# Patient Record
Sex: Male | Born: 1990 | Race: Asian | Hispanic: No | Marital: Single | Smoking: Current some day smoker
Health system: Southern US, Community
[De-identification: ages and names within clinical notes are randomized; demographics above are authoritative.]

## PROBLEM LIST (undated history)

## (undated) HISTORY — PX: KNEE SURGERY: SHX244

---

## 2021-01-19 ENCOUNTER — Other Ambulatory Visit: Payer: Self-pay

## 2021-01-19 ENCOUNTER — Emergency Department (HOSPITAL_COMMUNITY)
Admission: EM | Admit: 2021-01-19 | Discharge: 2021-01-20 | Disposition: A | Discharge status: Home / Self Care | Payer: 59 | Attending: Emergency Medicine | Admitting: Emergency Medicine

## 2021-01-19 ENCOUNTER — Emergency Department (HOSPITAL_COMMUNITY): Payer: 59

## 2021-01-19 DIAGNOSIS — S52002A Unspecified fracture of upper end of left ulna, initial encounter for closed fracture: Secondary | ICD-10-CM | POA: Diagnosis not present

## 2021-01-19 DIAGNOSIS — Y9366 Activity, soccer: Secondary | ICD-10-CM | POA: Insufficient documentation

## 2021-01-19 DIAGNOSIS — U071 COVID-19: Secondary | ICD-10-CM | POA: Diagnosis not present

## 2021-01-19 DIAGNOSIS — W010XXA Fall on same level from slipping, tripping and stumbling without subsequent striking against object, initial encounter: Secondary | ICD-10-CM | POA: Diagnosis not present

## 2021-01-19 DIAGNOSIS — S52092A Other fracture of upper end of left ulna, initial encounter for closed fracture: Secondary | ICD-10-CM

## 2021-01-19 DIAGNOSIS — S59912A Unspecified injury of left forearm, initial encounter: Secondary | ICD-10-CM | POA: Diagnosis present

## 2021-01-19 MED ORDER — OXYCODONE-ACETAMINOPHEN 5-325 MG PO TABS
2.0000 | ORAL_TABLET | Freq: Once | ORAL | Status: AC
  Administered 2021-01-19: 2 via ORAL
  Filled 2021-01-19: qty 2

## 2021-01-19 MED ORDER — FENTANYL CITRATE (PF) 100 MCG/2ML IJ SOLN
100.0000 ug | Freq: Once | INTRAMUSCULAR | Status: AC
  Administered 2021-01-19: 100 ug via INTRAVENOUS
  Filled 2021-01-19: qty 2

## 2021-01-19 MED ORDER — OXYCODONE-ACETAMINOPHEN 5-325 MG PO TABS: 1.0000 | ORAL_TABLET | ORAL | 0 refills | Status: DC | PRN

## 2021-01-19 MED ORDER — SODIUM CHLORIDE 0.9 % IV BOLUS
1000.0000 mL | Freq: Once | INTRAVENOUS | Status: AC
  Administered 2021-01-19: 1000 mL via INTRAVENOUS

## 2021-01-19 MED ORDER — HYDROMORPHONE HCL 1 MG/ML IJ SOLN
1.0000 mg | Freq: Once | INTRAMUSCULAR | Status: AC
  Administered 2021-01-19: 1 mg via INTRAVENOUS
  Filled 2021-01-19: qty 1

## 2021-01-19 MED ORDER — OXYCODONE-ACETAMINOPHEN 5-325 MG PO TABS
2.0000 | ORAL_TABLET | ORAL | Status: DC | PRN
Start: 2021-01-19 — End: 2021-01-20
  Administered 2021-01-19: 2 via ORAL
  Filled 2021-01-19: qty 2

## 2021-01-19 MED ORDER — KETOROLAC TROMETHAMINE 15 MG/ML IJ SOLN
15.0000 mg | Freq: Once | INTRAMUSCULAR | Status: AC
  Administered 2021-01-19: 15 mg via INTRAVENOUS
  Filled 2021-01-19: qty 1

## 2021-01-19 NOTE — Discharge Instructions (Addendum)
Take both ibuprofen and Tylenol for pain.  You may alternate these as needed.  If you are still having severe pain, then take the stronger pain medicine oxycodone.

## 2021-01-19 NOTE — ED Provider Notes (Signed)
Emergency Medicine Provider Triage Evaluation Note  Keston Zubab Lang Zingg , a 30 y.o. male  was evaluated in triage.  Pt complains of pain in the left arm.  He is right-hand dominant.  He was playing soccer, fell and heard a crack in his arm.  He states that he felt backwards and braced himself.  He denies any pain anywhere other than his left arm.  His pain is primarily around the elbow.  He did not strike his head..  Review of Systems  Positive: Left arm pain Negative: Numbness, weakness, tingling  Physical Exam  BP 104/81 (BP Location: Right Arm)   Pulse 62   Temp 98.8 F (37.1 C) (Oral)   Resp 20   SpO2 100%  Gen:   Awake, appears to be in pain Resp:  Normal effort  MSK:   Left elbow is obviously swollen and deformed.  He is able to move fingers on left hand.  There is no localized tenderness over the left shoulder. Other:  No pain along left clavicle.  Medical Decision Making  Medically screening exam initiated at 9:56 PM.  Appropriate orders placed.  Edilson Zubab Ibne Komperda was informed that the remainder of the evaluation will be completed by another provider, this initial triage assessment does not replace that evaluation, and the importance of remaining in the ED until their evaluation is complete.     Norman Clay 01/19/21 2157    Pollyann Savoy, Sumedh 01/19/21 2232

## 2021-01-19 NOTE — ED Triage Notes (Signed)
Pt reports playing soccer, fell, heard a crack in his arm. Pt with left arm deformity. Appears in distress due to pain.

## 2021-01-19 NOTE — ED Provider Notes (Signed)
[facility] EMERGENCY DEPARTMENT Provider Note   CSN: [id number] Arrival date & time: 01/19/21  2056     History Chief Complaint  Patient presents with   Arm Injury    Steven Aguirre is a 30 y.o. male.  HPI 30 year old male presents after a left arm injury.  He was playing soccer and he slipped and his left arm went behind him causing an injury to his proximal forearm.  He is having severe pain.  He was given oral Percocet in the waiting room without relief.  No numbness or weakness.  No other injuries.  No past medical history on file.  There are no problems to display for this patient.        No family history on file.     Home Medications Prior to Admission medications   Medication Sig Start Date End Date Taking? Authorizing Provider  acetaminophen (TYLENOL) 500 MG tablet Take 500 mg by mouth every 6 (six) hours as needed for moderate pain or headache.   Yes Provider, Historical, Lonnie  ibuprofen (ADVIL) 200 MG tablet Take 800 mg by mouth every 6 (six) hours as needed for mild pain or headache.   Yes Provider, Historical, Mehki  oxyCODONE-acetaminophen (PERCOCET/ROXICET) 5-325 MG tablet Take 1 tablet by mouth every 4 (four) hours as needed for severe pain. 01/19/21  Yes Pricilla Loveless, Cosby  vitamin C (ASCORBIC ACID) 500 MG tablet Take 500 mg by mouth daily.   Yes Provider, Historical, Khriz    Allergies    Patient has no known allergies.  Review of Systems   Review of Systems  Musculoskeletal:  Positive for arthralgias and joint swelling.  Neurological:  Negative for weakness and numbness.  All other systems reviewed and are negative.  Physical Exam Updated Vital Signs BP 112/82   Pulse 90   Temp 98.8 F (37.1 C) (Oral)   Resp 19   SpO2 95%   Physical Exam Vitals and nursing note reviewed.  Constitutional:      General: He is in acute distress (in pain).     Appearance: He is well-developed.  HENT:     Head: Normocephalic and atraumatic.      Right Ear: External ear normal.     Left Ear: External ear normal.     Nose: Nose normal.  Eyes:     General:        Right eye: No discharge.        Left eye: No discharge.  Cardiovascular:     Rate and Rhythm: Normal rate and regular rhythm.     Pulses:          Radial pulses are 2+ on the left side.  Pulmonary:     Effort: Pulmonary effort is normal.  Abdominal:     General: There is no distension.  Musculoskeletal:     Left upper arm: No tenderness.     Left elbow: Tenderness present.     Left forearm: Swelling (proximal) and tenderness present.     Left wrist: No swelling, deformity or tenderness. Normal range of motion.     Left hand: No swelling or tenderness. Normal range of motion.     Cervical back: Neck supple.     Comments: Normal strength/sensation in left hand Normal radial, ulnar, median nerve testing and left hand.  Skin:    General: Skin is warm and dry.  Neurological:     Mental Status: He is alert.  Psychiatric:  Mood and Affect: Mood is not anxious.    ED Results / Procedures / Treatments   Labs (all labs ordered are listed, but only abnormal results are displayed) Labs Reviewed  RESP PANEL BY RT-PCR (FLU A&B, COVID) [id number]    EKG None  Radiology DG Forearm Left  Result Date: 01/19/2021 CLINICAL DATA:  Fall while playing soccer with arm pain, initial encounter EXAM: LEFT FOREARM - 2 VIEW COMPARISON:  None. FINDINGS: Comminuted proximal ulnar fracture is noted without evidence of dislocation. Small avulsion is noted likely from the lateral epicondyle. No definitive radial fracture is seen. IMPRESSION: Comminuted proximal ulnar fracture without dislocation. Electronically Signed   By: Alcide Clever M.D.   On: 01/19/2021 22:55   DG Humerus Left  Result Date: 01/19/2021 CLINICAL DATA:  Recent fall while playing soccer with arm deformity, initial encounter EXAM: LEFT HUMERUS - 2+ VIEW COMPARISON:  None. FINDINGS: Humerus is well visualized and  within normal limits. Comminuted fracture of the proximal ulna is seen. No dislocation is noted. IMPRESSION: Proximal ulnar fracture is noted. The humerus appears within normal limits. Electronically Signed   By: Alcide Clever M.D.   On: 01/19/2021 22:56    Procedures Procedures   Medications Ordered in ED Medications  oxyCODONE-acetaminophen (PERCOCET/ROXICET) 5-325 MG per tablet 2 tablet (2 tablets Oral Given 01/19/21 2156)  ketorolac (TORADOL) 15 MG/ML injection 15 mg (has no administration in time range)  oxyCODONE-acetaminophen (PERCOCET/ROXICET) 5-325 MG per tablet 2 tablet (has no administration in time range)  fentaNYL (SUBLIMAZE) injection 100 mcg (100 mcg Intravenous Given 01/19/21 2215)  sodium chloride 0.9 % bolus 1,000 mL (0 mLs Intravenous Stopped 01/19/21 2258)  HYDROmorphone (DILAUDID) injection 1 mg (1 mg Intravenous Given 01/19/21 2303)    ED Course  I have reviewed the triage vital signs and the nursing notes.  Pertinent labs & imaging results that were available during my care of the patient were reviewed by me and considered in my medical decision making (see chart for details).    MDM Rules/Calculators/A&P                          Patient's x-rays have been personally reviewed and he has a proximal ulna fracture that is comminuted but not displaced.  He has swelling to his proximal forearm but no open injury.  His pain is now better controlled.  I discussed with Dr. Dion Saucier, will place in long-arm posterior splint and have him follow-up in the next 3-5 days in his office.  We will send pain medicine and we discussed return precautions but his pain seems much better and he appears stable for discharge home.  No neurovascular injury at this point. Final Clinical Impression(s) / ED Diagnoses Final diagnoses:  Closed comminuted fracture of proximal end of left ulna, initial encounter    Rx / DC Orders ED Discharge Orders          Ordered    oxyCODONE-acetaminophen  (PERCOCET/ROXICET) 5-325 MG tablet  Every 4 hours PRN        01/19/21 2324             Pricilla Loveless, Elgie 01/19/21 2328

## 2021-01-20 LAB — RESP PANEL BY RT-PCR (FLU A&B, COVID) ARPGX2
Influenza A by PCR: NEGATIVE
Influenza B by PCR: NEGATIVE
SARS Coronavirus 2 by RT PCR: POSITIVE — AB

## 2021-01-20 NOTE — Progress Notes (Signed)
Orthopedic Tech Progress Note Patient Details:  Steven Aguirre [birth year] [id number]  Ortho Devices Type of Ortho Device: Long arm splint, Arm sling Ortho Device/Splint Location: LUE Ortho Device/Splint Interventions: Ordered, Application, Adjustment   Post Interventions Patient Tolerated: Poor Instructions Provided: Adjustment of device, Care of device, Poper ambulation with device  Steven Aguirre 01/20/2021, 12:14 AM

## 2021-01-23 ENCOUNTER — Other Ambulatory Visit: Payer: Self-pay

## 2021-01-23 ENCOUNTER — Encounter (HOSPITAL_COMMUNITY): Payer: Self-pay | Admitting: Orthopaedic Surgery

## 2021-01-23 NOTE — Progress Notes (Signed)
COVID Vaccine Completed: Yes Date COVID Vaccine completed: 1 Has received booster:x1 COVID vaccine manufacturer:   [employer]  Date of COVID positive in last 90 days: 01/19/2021  PCP - N/A Cardiologist - N/A  Chest x-ray - N/A EKG - N/A Stress Test - N/A ECHO - N/A Cardiac Cath - N/A Pacemaker/ICD device last checked:N/A  Sleep Study - N/A CPAP - N/A  Fasting Blood Sugar - N/A Checks Blood Sugar ___N/A__ times a day  Blood Thinner Instructions: N/A Aspirin Instructions: N/A Last Dose: N/A  Activity level:  Able to exercise without symptoms     Anesthesia review: N/A  Patient denies shortness of breath, fever, cough and chest pain at PAT appointment   Patient verbalized understanding of instructions that were given to them at the PAT appointment. Patient was also instructed that they will need to review over the PAT instructions again at home before surgery.

## 2021-01-23 NOTE — H&P (Signed)
PREOPERATIVE H&P  Chief Complaint: LEFT ELBOW FRACTURE,COLLATERAL LIGAMENT TEAR  HPI: Steven Aguirre is a 30 y.o. male who is scheduled for, Procedure(s): OPEN REDUCTION INTERNAL FIXATION (ORIF) ELBOW/OLECRANON FRACTURE ULNAR COLLATERAL LIGAMENT REPAIR.   Patient is a healthy 30 year old who had an arm injury on 6/17. He was playing soccer and slipped. He tried to catch himself with his left hand. He had immediate pain. He went to [facility] ED. Xrays showed proximal ulnar fracture. He was splinted and sent to orthopedics for surgical discussion.   His symptoms are rated as moderate to severe, and have been worsening.  This is significantly impairing activities of daily living.    Please see clinic note for further details on this patient's care.    He has elected for surgical management.   History reviewed. No pertinent past medical history. Past Surgical History:  Procedure Laterality Date   KNEE SURGERY Right    Social History   Socioeconomic History   Marital status: Single    Spouse name: Not on file   Number of children: Not on file   Years of education: Not on file   Highest education level: Not on file  Occupational History   Not on file  Tobacco Use   Smoking status: Some Days    Pack years: 0.00    Types: Cigarettes   Smokeless tobacco: Former   Tobacco comments:    Smoke one a week or twice a month  Vaping Use   Vaping Use: Not on file  Substance and Sexual Activity   Alcohol use: Never   Drug use: Never   Sexual activity: Not on file  Other Topics Concern   Not on file  Social History Narrative   Not on file   Social Determinants of Health   Financial Resource Strain: Not on file  Food Insecurity: Not on file  Transportation Needs: Not on file  Physical Activity: Not on file  Stress: Not on file  Social Connections: Not on file   History reviewed. No pertinent family history. No Known Allergies Prior to Admission medications    Medication Sig Start Date End Date Taking? Authorizing Provider  acetaminophen (TYLENOL) 500 MG tablet Take 500 mg by mouth every 6 (six) hours as needed for moderate pain or headache.    Provider, Historical, Armoni  ibuprofen (ADVIL) 200 MG tablet Take 800 mg by mouth every 6 (six) hours as needed for mild pain or headache.    Provider, Historical, Raef  oxyCODONE-acetaminophen (PERCOCET/ROXICET) 5-325 MG tablet Take 1 tablet by mouth every 4 (four) hours as needed for severe pain. 01/19/21   Pricilla Loveless, Elius  vitamin C (ASCORBIC ACID) 500 MG tablet Take 500 mg by mouth daily.    Provider, Historical, Arnie    ROS: All other systems have been reviewed and were otherwise negative with the exception of those mentioned in the HPI and as above.  Physical Exam: General: Alert, no acute distress Cardiovascular: No pedal edema Respiratory: No cyanosis, no use of accessory musculature GI: No organomegaly, abdomen is soft and non-tender Skin: No lesions in the area of chief complaint Neurologic: Sensation intact distally Psychiatric: Patient is competent for consent with normal mood and affect Lymphatic: No axillary or cervical lymphadenopathy  MUSCULOSKELETAL:  Left upper extremity: Splint CDI. Skin intact though cannot assess fully beneath splint. Nontender to palpation proximally. Distal motor and sensory function intact. Well perfused digits.    Imaging: Xrays demonstrate comminuted proximal ulnar  fracture is noted without evidence of dislocation. Small avulsion is noted likely from the lateral epicondyle. No definitive radial fracture is seen.  Assessment: LEFT ELBOW FRACTURE,COLLATERAL LIGAMENT TEAR  Plan: Plan for Procedure(s): OPEN REDUCTION INTERNAL FIXATION (ORIF) ELBOW/OLECRANON FRACTURE ULNAR COLLATERAL LIGAMENT REPAIR  The risks benefits and alternatives were discussed with the patient including but not limited to the risks of nonoperative treatment, versus surgical intervention  including infection, bleeding, nerve injury,  blood clots, cardiopulmonary complications, morbidity, mortality, among others, and they were willing to proceed.   The patient acknowledged the explanation, agreed to proceed with the plan and consent was signed.   Operative Plan: ORIF proximal ulnar fracture with repair of ulnar collateral ligament Discharge Medications: Tylenol, Gabapentin, Meloxicam, Oxycodone, Zofran DVT Prophylaxis: None Physical Therapy: Outpatient Special Discharge needs: Splint. Sling   Vernetta Honey, PA-C  01/23/2021 5:22 PM

## 2021-01-25 ENCOUNTER — Ambulatory Visit (HOSPITAL_COMMUNITY): Payer: 59

## 2021-01-25 ENCOUNTER — Ambulatory Visit (HOSPITAL_COMMUNITY)
Admission: RE | Admit: 2021-01-25 | Discharge: 2021-01-25 | Disposition: A | Discharge status: Home / Self Care | Payer: 59 | Attending: Orthopaedic Surgery | Admitting: Orthopaedic Surgery

## 2021-01-25 ENCOUNTER — Encounter (HOSPITAL_COMMUNITY): Payer: Self-pay | Admitting: Orthopaedic Surgery

## 2021-01-25 ENCOUNTER — Encounter (HOSPITAL_COMMUNITY)
Admission: RE | Disposition: A | Discharge status: Home / Self Care | Payer: Self-pay | Source: Home / Self Care | Attending: Orthopaedic Surgery

## 2021-01-25 ENCOUNTER — Ambulatory Visit (HOSPITAL_COMMUNITY): Payer: 59 | Admitting: Physician Assistant

## 2021-01-25 DIAGNOSIS — Y9366 Activity, soccer: Secondary | ICD-10-CM | POA: Diagnosis not present

## 2021-01-25 DIAGNOSIS — Z419 Encounter for procedure for purposes other than remedying health state, unspecified: Secondary | ICD-10-CM

## 2021-01-25 DIAGNOSIS — S52022A Displaced fracture of olecranon process without intraarticular extension of left ulna, initial encounter for closed fracture: Secondary | ICD-10-CM | POA: Insufficient documentation

## 2021-01-25 DIAGNOSIS — F17210 Nicotine dependence, cigarettes, uncomplicated: Secondary | ICD-10-CM | POA: Diagnosis not present

## 2021-01-25 HISTORY — PX: ORIF ELBOW FRACTURE: SHX5031

## 2021-01-25 HISTORY — PX: ULNAR COLLATERAL LIGAMENT REPAIR: SHX6159

## 2021-01-25 SURGERY — OPEN REDUCTION INTERNAL FIXATION (ORIF) ELBOW/OLECRANON FRACTURE
Anesthesia: General | Site: Elbow | Laterality: Left

## 2021-01-25 MED ORDER — AMISULPRIDE (ANTIEMETIC) 5 MG/2ML IV SOLN: 10.0000 mg | Freq: Once | INTRAVENOUS | Status: DC | PRN

## 2021-01-25 MED ORDER — FENTANYL CITRATE (PF) 100 MCG/2ML IJ SOLN
INTRAMUSCULAR | Status: AC
  Filled 2021-01-25: qty 2

## 2021-01-25 MED ORDER — FENTANYL CITRATE (PF) 100 MCG/2ML IJ SOLN: 25.0000 ug | INTRAMUSCULAR | Status: DC | PRN

## 2021-01-25 MED ORDER — PROPOFOL 10 MG/ML IV BOLUS
INTRAVENOUS | Status: DC | PRN
  Administered 2021-01-25: 150 mg via INTRAVENOUS

## 2021-01-25 MED ORDER — OXYCODONE HCL 5 MG PO TABS: 5.0000 mg | ORAL_TABLET | Freq: Once | ORAL | Status: DC | PRN

## 2021-01-25 MED ORDER — MEPERIDINE HCL 50 MG/ML IJ SOLN
INTRAMUSCULAR | Status: AC
  Filled 2021-01-25: qty 1

## 2021-01-25 MED ORDER — CEFAZOLIN SODIUM-DEXTROSE 2-4 GM/100ML-% IV SOLN
INTRAVENOUS | Status: AC
  Filled 2021-01-25: qty 100

## 2021-01-25 MED ORDER — LIDOCAINE 2% (20 MG/ML) 5 ML SYRINGE
INTRAMUSCULAR | Status: DC | PRN
  Administered 2021-01-25: 40 mg via INTRAVENOUS

## 2021-01-25 MED ORDER — KETAMINE HCL 10 MG/ML IJ SOLN
INTRAMUSCULAR | Status: DC | PRN
  Administered 2021-01-25 (×4): 5 mg via INTRAVENOUS

## 2021-01-25 MED ORDER — KETOROLAC TROMETHAMINE 30 MG/ML IJ SOLN
INTRAMUSCULAR | Status: AC
  Administered 2021-01-25: 30 mg via INTRAVENOUS
  Filled 2021-01-25: qty 1

## 2021-01-25 MED ORDER — ONDANSETRON HCL 4 MG/2ML IJ SOLN
INTRAMUSCULAR | Status: DC | PRN
  Administered 2021-01-25: 4 mg via INTRAVENOUS

## 2021-01-25 MED ORDER — DEXAMETHASONE SODIUM PHOSPHATE 10 MG/ML IJ SOLN
INTRAMUSCULAR | Status: AC
  Filled 2021-01-25: qty 1

## 2021-01-25 MED ORDER — DICLOFENAC SODIUM 75 MG PO TBEC: 75.0000 mg | DELAYED_RELEASE_TABLET | Freq: Two times a day (BID) | ORAL | 0 refills | Status: AC

## 2021-01-25 MED ORDER — PROMETHAZINE HCL 25 MG/ML IJ SOLN: 6.2500 mg | INTRAMUSCULAR | Status: DC | PRN

## 2021-01-25 MED ORDER — KETAMINE HCL 10 MG/ML IJ SOLN
INTRAMUSCULAR | Status: AC
  Filled 2021-01-25: qty 1

## 2021-01-25 MED ORDER — LIDOCAINE 2% (20 MG/ML) 5 ML SYRINGE
INTRAMUSCULAR | Status: AC
  Filled 2021-01-25: qty 5

## 2021-01-25 MED ORDER — CEFAZOLIN SODIUM-DEXTROSE 2-4 GM/100ML-% IV SOLN
2.0000 g | INTRAVENOUS | Status: AC
  Administered 2021-01-25: 2 g via INTRAVENOUS

## 2021-01-25 MED ORDER — PROPOFOL 10 MG/ML IV BOLUS
INTRAVENOUS | Status: AC
  Filled 2021-01-25: qty 20

## 2021-01-25 MED ORDER — LACTATED RINGERS IV SOLN: INTRAVENOUS | Status: DC

## 2021-01-25 MED ORDER — SUCCINYLCHOLINE CHLORIDE 20 MG/ML IJ SOLN
INTRAMUSCULAR | Status: DC | PRN
  Administered 2021-01-25: 60 mg via INTRAVENOUS

## 2021-01-25 MED ORDER — BUPIVACAINE-EPINEPHRINE (PF) 0.5% -1:200000 IJ SOLN
INTRAMUSCULAR | Status: DC | PRN
  Administered 2021-01-25: 30 mL via PERINEURAL

## 2021-01-25 MED ORDER — VANCOMYCIN HCL 1000 MG IV SOLR
INTRAVENOUS | Status: DC | PRN
  Administered 2021-01-25: 1000 mg

## 2021-01-25 MED ORDER — MIDAZOLAM HCL 2 MG/2ML IJ SOLN
INTRAMUSCULAR | Status: AC
  Filled 2021-01-25: qty 2

## 2021-01-25 MED ORDER — BUPIVACAINE HCL 0.25 % IJ SOLN
INTRAMUSCULAR | Status: AC
  Filled 2021-01-25: qty 1

## 2021-01-25 MED ORDER — MIDAZOLAM HCL 5 MG/5ML IJ SOLN
INTRAMUSCULAR | Status: DC | PRN
  Administered 2021-01-25: 2 mg via INTRAVENOUS

## 2021-01-25 MED ORDER — ACETAMINOPHEN 500 MG PO TABS: 1000.0000 mg | ORAL_TABLET | Freq: Three times a day (TID) | ORAL | 0 refills | Status: AC

## 2021-01-25 MED ORDER — ACETAMINOPHEN 10 MG/ML IV SOLN: 1000.0000 mg | Freq: Once | INTRAVENOUS | Status: DC | PRN

## 2021-01-25 MED ORDER — ONDANSETRON HCL 4 MG PO TABS: 4.0000 mg | ORAL_TABLET | Freq: Three times a day (TID) | ORAL | 0 refills | Status: AC | PRN

## 2021-01-25 MED ORDER — MEPERIDINE HCL 50 MG/ML IJ SOLN
6.2500 mg | INTRAMUSCULAR | Status: DC | PRN
Start: 2021-01-25 — End: 2021-01-25
  Administered 2021-01-25: 6.25 mg via INTRAVENOUS

## 2021-01-25 MED ORDER — ONDANSETRON HCL 4 MG/2ML IJ SOLN
INTRAMUSCULAR | Status: AC
  Filled 2021-01-25: qty 2

## 2021-01-25 MED ORDER — OXYCODONE HCL 5 MG PO TABS: ORAL_TABLET | ORAL | 0 refills | Status: AC

## 2021-01-25 MED ORDER — DEXAMETHASONE SODIUM PHOSPHATE 10 MG/ML IJ SOLN
INTRAMUSCULAR | Status: DC | PRN
  Administered 2021-01-25: 5 mg via INTRAVENOUS

## 2021-01-25 MED ORDER — OXYCODONE HCL 5 MG/5ML PO SOLN
5.0000 mg | Freq: Once | ORAL | Status: DC | PRN
Start: 2021-01-25 — End: 2021-01-25

## 2021-01-25 MED ORDER — GABAPENTIN 100 MG PO CAPS: 100.0000 mg | ORAL_CAPSULE | Freq: Three times a day (TID) | ORAL | 0 refills | Status: AC

## 2021-01-25 MED ORDER — FENTANYL CITRATE (PF) 100 MCG/2ML IJ SOLN
INTRAMUSCULAR | Status: DC | PRN
  Administered 2021-01-25 (×2): 25 ug via INTRAVENOUS
  Administered 2021-01-25: 50 ug via INTRAVENOUS

## 2021-01-25 MED ORDER — DEXMEDETOMIDINE (PRECEDEX) IN NS 20 MCG/5ML (4 MCG/ML) IV SYRINGE
PREFILLED_SYRINGE | INTRAVENOUS | Status: AC
  Filled 2021-01-25: qty 5

## 2021-01-25 MED ORDER — KETOROLAC TROMETHAMINE 30 MG/ML IJ SOLN: 30.0000 mg | Freq: Once | INTRAMUSCULAR | Status: AC

## 2021-01-25 SURGICAL SUPPLY — 114 items
ADAPTER K-WIRE FAST GUIDE 2.0 (WIRE) ×3 IMPLANT
ANCHOR JUGGERKNOT W/DRL 2/1.45 (Anchor) ×3 IMPLANT
BAG COUNTER SPONGE SURGICOUNT (BAG) IMPLANT
BAG SURGICOUNT SPONGE COUNTING (BAG)
BENZOIN TINCTURE PRP APPL 2/3 (GAUZE/BANDAGES/DRESSINGS) IMPLANT
BIT DRILL 2.0 (BIT) ×1
BIT DRILL 2.0MM (BIT) ×1
BIT DRILL 2.5X2.75 QC CALB (BIT) ×3 IMPLANT
BIT DRILL 2XNS DISP SS SM FRAG (BIT) ×1 IMPLANT
BIT DRILL CALIBRATED 2.7 (BIT) ×2 IMPLANT
BIT DRILL CALIBRATED 2.7MM (BIT) ×1
BIT DRL 2XNS DISP SS SM FRAG (BIT) ×1
BLADE HEX COATED 2.75 (ELECTRODE) IMPLANT
BLADE SURG 15 STRL LF DISP TIS (BLADE) ×1 IMPLANT
BLADE SURG 15 STRL SS (BLADE) ×2
BLADE SURG SZ10 CARB STEEL (BLADE) IMPLANT
BNDG COHESIVE 4X5 TAN STRL (GAUZE/BANDAGES/DRESSINGS) IMPLANT
BNDG ELASTIC 4X5.8 VLCR STR LF (GAUZE/BANDAGES/DRESSINGS) ×3 IMPLANT
BNDG ESMARK 4X9 LF (GAUZE/BANDAGES/DRESSINGS) IMPLANT
BOOTIES KNEE HIGH SLOAN (MISCELLANEOUS) IMPLANT
BRUSH SCRUB EZ PLAIN DRY (MISCELLANEOUS) ×3 IMPLANT
BUR EGG ELITE 4.0 (BURR) ×2 IMPLANT
BUR EGG ELITE 4.0MM (BURR) ×1
CHLORAPREP W/TINT 26 (MISCELLANEOUS) IMPLANT
CLOSURE STERI-STRIP 1/2X4 (GAUZE/BANDAGES/DRESSINGS) ×1
CLOSURE WOUND 1/2 X4 (GAUZE/BANDAGES/DRESSINGS)
CLSR STERI-STRIP ANTIMIC 1/2X4 (GAUZE/BANDAGES/DRESSINGS) ×2 IMPLANT
COVER BACK TABLE 60X90IN (DRAPES) IMPLANT
CUFF TOURN SGL QUICK 18X4 (TOURNIQUET CUFF) ×3 IMPLANT
CUFF TOURN SGL QUICK 24 (TOURNIQUET CUFF)
CUFF TOURN SGL QUICK 34 (TOURNIQUET CUFF)
CUFF TRNQT CYL 24X4X16.5-23 (TOURNIQUET CUFF) IMPLANT
CUFF TRNQT CYL 34X4.125X (TOURNIQUET CUFF) IMPLANT
DECANTER SPIKE VIAL GLASS SM (MISCELLANEOUS) IMPLANT
DRAPE C-ARM 42X120 X-RAY (DRAPES) ×3 IMPLANT
DRAPE C-ARMOR (DRAPES) ×3 IMPLANT
DRAPE EXTREMITY T 121X128X90 (DISPOSABLE) IMPLANT
DRAPE IMP U-DRAPE 54X76 (DRAPES) IMPLANT
DRAPE INCISE IOBAN 66X45 STRL (DRAPES) IMPLANT
DRAPE OEC MINIVIEW 54X84 (DRAPES) IMPLANT
DRAPE ORTHO SPLIT 77X108 STRL (DRAPES) ×4
DRAPE SHEET LG 3/4 BI-LAMINATE (DRAPES) ×3 IMPLANT
DRAPE SURG ORHT 6 SPLT 77X108 (DRAPES) ×2 IMPLANT
DRAPE U-SHAPE 47X51 STRL (DRAPES) IMPLANT
DRSG AQUACEL AG ADV 3.5X 6 (GAUZE/BANDAGES/DRESSINGS) IMPLANT
DRSG PAD ABDOMINAL 8X10 ST (GAUZE/BANDAGES/DRESSINGS) ×3 IMPLANT
DRSG TEGADERM 4X4.75 (GAUZE/BANDAGES/DRESSINGS) IMPLANT
DURAPREP 26ML APPLICATOR (WOUND CARE) ×6 IMPLANT
ELECT REM PT RETURN 15FT ADLT (MISCELLANEOUS) ×3 IMPLANT
GAUZE SPONGE 4X4 12PLY STRL (GAUZE/BANDAGES/DRESSINGS) ×3 IMPLANT
GAUZE XEROFORM 1X8 LF (GAUZE/BANDAGES/DRESSINGS) IMPLANT
GAUZE XEROFORM 5X9 LF (GAUZE/BANDAGES/DRESSINGS) IMPLANT
GLOVE SRG 8 PF TXTR STRL LF DI (GLOVE) ×1 IMPLANT
GLOVE SURG ENC MOIS LTX SZ6.5 (GLOVE) IMPLANT
GLOVE SURG LTX SZ8 (GLOVE) IMPLANT
GLOVE SURG UNDER POLY LF SZ6.5 (GLOVE) ×3 IMPLANT
GLOVE SURG UNDER POLY LF SZ8 (GLOVE) ×2
GOWN STRL REUS W/ TWL LRG LVL3 (GOWN DISPOSABLE) ×1 IMPLANT
GOWN STRL REUS W/TWL LRG LVL3 (GOWN DISPOSABLE) ×8 IMPLANT
GOWN STRL REUS W/TWL XL LVL3 (GOWN DISPOSABLE) ×6 IMPLANT
K-WIRE FIXATION 2.0X6 (WIRE) ×3
KIT BASIN OR (CUSTOM PROCEDURE TRAY) ×3 IMPLANT
KWIRE FIXATION 2.0X6 (WIRE) ×1 IMPLANT
NS IRRIG 1000ML POUR BTL (IV SOLUTION) ×3 IMPLANT
PACK ARTHROSCOPY WL (CUSTOM PROCEDURE TRAY) ×3 IMPLANT
PACK SHOULDER (CUSTOM PROCEDURE TRAY) ×3 IMPLANT
PAD CAST 4YDX4 CTTN HI CHSV (CAST SUPPLIES) ×2 IMPLANT
PADDING CAST COTTON 4X4 STRL (CAST SUPPLIES) ×4
PENCIL SMOKE EVACUATOR (MISCELLANEOUS) IMPLANT
PLATE OLECRANON LRG (Plate) ×3 IMPLANT
RETRIEVER SUT HEWSON (MISCELLANEOUS) IMPLANT
SCREW CORTICAL 2.7MM  20MM (Screw) ×2 IMPLANT
SCREW CORTICAL 2.7MM 20MM (Screw) ×1 IMPLANT
SCREW CORTICAL LOW PROF 3.5X20 (Screw) ×6 IMPLANT
SCREW LOCK CORT STAR 3.5X20 (Screw) ×3 IMPLANT
SCREW LOW PROFILE 22MMX3.5MM (Screw) ×3 IMPLANT
SCREW NON LOCKING LP 3.5 16MM (Screw) ×3 IMPLANT
SLEEVE SCD COMPRESS KNEE MED (STOCKING) ×3 IMPLANT
SLING ARM FOAM STRAP LRG (SOFTGOODS) ×3 IMPLANT
SLING ARM FOAM STRAP MED (SOFTGOODS) IMPLANT
SLING ARM FOAM STRAP XLG (SOFTGOODS) IMPLANT
SLING ARM IMMOBILIZER LRG (SOFTGOODS) IMPLANT
SLING ARM IMMOBILIZER MED (SOFTGOODS) IMPLANT
SPLINT PLASTER CAST XFAST 5X30 (CAST SUPPLIES) ×10 IMPLANT
SPLINT PLASTER XFAST SET 5X30 (CAST SUPPLIES) ×20
SPONGE T-LAP 18X18 ~~LOC~~+RFID (SPONGE) IMPLANT
STAPLER VISISTAT 35W (STAPLE) IMPLANT
STOCKINETTE 8 INCH (MISCELLANEOUS) IMPLANT
STRIP CLOSURE SKIN 1/2X4 (GAUZE/BANDAGES/DRESSINGS) IMPLANT
SUCTION FRAZIER HANDLE 10FR (MISCELLANEOUS)
SUCTION FRAZIER HANDLE 12FR (TUBING) ×2
SUCTION TUBE FRAZIER 10FR DISP (MISCELLANEOUS) IMPLANT
SUCTION TUBE FRAZIER 12FR DISP (TUBING) ×1 IMPLANT
SUT ETHILON 3 0 PS 1 (SUTURE) IMPLANT
SUT FIBERWIRE #2 38 REV NDL BL (SUTURE)
SUT MNCRL AB 4-0 PS2 18 (SUTURE) ×3 IMPLANT
SUT VIC AB 0 CT1 27 (SUTURE) ×6
SUT VIC AB 0 CT1 27XBRD ANBCTR (SUTURE) ×3 IMPLANT
SUT VIC AB 1 CT1 27 (SUTURE)
SUT VIC AB 1 CT1 27XBRD ANBCTR (SUTURE) IMPLANT
SUT VIC AB 2-0 CT1 (SUTURE) IMPLANT
SUT VIC AB 2-0 SH 27 (SUTURE)
SUT VIC AB 2-0 SH 27XBRD (SUTURE) IMPLANT
SUT VIC AB 3-0 SH 27 (SUTURE) ×2
SUT VIC AB 3-0 SH 27X BRD (SUTURE) ×1 IMPLANT
SUTURE FIBERWR#2 38 REV NDL BL (SUTURE) IMPLANT
SUTURE TAPE 1.3 FIBERLOP 20 ST (SUTURE) IMPLANT
SUTURETAPE 1.3 FIBERLOOP 20 ST (SUTURE)
SYR BULB EAR ULCER 3OZ GRN STR (SYRINGE) ×3 IMPLANT
TOWEL OR 17X26 10 PK STRL BLUE (TOWEL DISPOSABLE) IMPLANT
TOWEL SURG RFD BLUE STRL DISP (DISPOSABLE) ×3 IMPLANT
TRAY PREP A LATEX SAFE STRL (SET/KITS/TRAYS/PACK) IMPLANT
TUBE SUCTION HIGH CAP CLEAR NV (SUCTIONS) IMPLANT
WASHER 3.5MM (Orthopedic Implant) ×3 IMPLANT

## 2021-01-25 NOTE — Anesthesia Procedure Notes (Signed)
Procedure Name: Intubation Date/Time: 01/25/2021 7:46 AM Performed by: Marny Lowenstein, CRNA Pre-anesthesia Checklist: Patient identified, Emergency Drugs available, Suction available and Patient being monitored Patient Re-evaluated:Patient Re-evaluated prior to induction Oxygen Delivery Method: Circle system utilized Preoxygenation: Pre-oxygenation with 100% oxygen Induction Type: IV induction and Rapid sequence Laryngoscope Size: Miller and 2 Grade View: Grade I Tube type: Oral Tube size: 7.5 mm Number of attempts: 1 Airway Equipment and Method: Patient positioned with wedge pillow and Stylet Placement Confirmation: ETT inserted through vocal cords under direct vision, positive ETCO2 and breath sounds checked- equal and bilateral Secured at: 23 cm Tube secured with: Tape Dental Injury: Teeth and Oropharynx as per pre-operative assessment

## 2021-01-25 NOTE — Anesthesia Preprocedure Evaluation (Signed)
Anesthesia Evaluation  Patient identified by MRN, date of birth, ID band Patient awake    Reviewed: Allergy & Precautions, NPO status , Patient's Chart, lab work & pertinent test results  Airway Mallampati: II  TM Distance: >3 FB Neck ROM: Full    Dental no notable dental hx.    Pulmonary Current Smoker,    Pulmonary exam normal breath sounds clear to auscultation       Cardiovascular negative cardio ROS Normal cardiovascular exam Rhythm:Regular Rate:Normal     Neuro/Psych negative neurological ROS  negative psych ROS   GI/Hepatic negative GI ROS, Neg liver ROS,   Endo/Other  negative endocrine ROS  Renal/GU negative Renal ROS     Musculoskeletal negative musculoskeletal ROS (+)   Abdominal   Peds  Hematology negative hematology ROS (+)   Anesthesia Other Findings LEFT ELBOW FRACTURE COLLATERAL LIGAMENT TEAR Covid positive  Reproductive/Obstetrics                             Anesthesia Physical Anesthesia Plan  ASA: 2  Anesthesia Plan: General   Post-op Pain Management:    Induction: Intravenous and Rapid sequence  PONV Risk Score and Plan: 1 and Ondansetron, Dexamethasone, Midazolam and Treatment may vary due to age or medical condition  Airway Management Planned: Oral ETT  Additional Equipment:   Intra-op Plan:   Post-operative Plan: Extubation in OR  Informed Consent: I have reviewed the patients History and Physical, chart, labs and discussed the procedure including the risks, benefits and alternatives for the proposed anesthesia with the patient or authorized representative who has indicated his/her understanding and acceptance.     Dental advisory given  Plan Discussed with: CRNA  Anesthesia Plan Comments:         Anesthesia Quick Evaluation

## 2021-01-25 NOTE — Transfer of Care (Signed)
Immediate Anesthesia Transfer of Care Note  Patient: Steven Aguirre  Procedure(s) Performed: OPEN REDUCTION INTERNAL FIXATION (ORIF) ELBOW/OLECRANON FRACTURE (Left: Elbow) ULNAR COLLATERAL LIGAMENT REPAIR (Left: Elbow)  Patient Location: PACU  Anesthesia Type:General and Regional  Level of Consciousness: drowsy  Airway & Oxygen Therapy: Patient Spontanous Breathing and Patient connected to face mask oxygen  Post-op Assessment: Report given to RN and Post -op Vital signs reviewed and stable  Post vital signs: Reviewed and stable  Last Vitals:  Vitals Value Taken Time  BP 127/84 01/25/21 0941  Temp    Pulse 72 01/25/21 0944  Resp 17 01/25/21 0944  SpO2 97 % 01/25/21 0944  Vitals shown include unvalidated device data.  Last Pain:  Vitals:   01/25/21 0611  TempSrc: Oral  PainSc: 8          Complications: No notable events documented.

## 2021-01-25 NOTE — Interval H&P Note (Signed)
I had a long discussion with the patient regarding his injury. He has an unstable injury of the proximal [place] and olecranon and possible injury to the lateral ligaments of his elbow. We think he would benefit from surgical fixation to stabilize the fracture. He understands the risks including but not limited to infection, hardware issues, heterotopic bone and stiffness. Neurovascular Greig Castilla is also discussed. His exam is limited current ly due to hand swelling and he has a [facility] of four but report intact sensation throughout the hand. Plan for ORIF and possible ligament repair as an outpatient.

## 2021-01-25 NOTE — Anesthesia Procedure Notes (Signed)
Anesthesia Regional Block: Supraclavicular block   Pre-Anesthetic Checklist: , timeout performed,  Correct Patient, Correct Site, Correct Laterality,  Correct Procedure, Correct Position, site marked,  Risks and benefits discussed,  Surgical consent,  Pre-op evaluation,  At surgeon's request and post-op pain management  Laterality: Left  Prep: chloraprep       Needles:  Injection technique: Single-shot  Needle Type: Echogenic Stimulator Needle     Needle Length: 9cm  Needle Gauge: 21     Additional Needles:   Procedures:,,,, ultrasound used (permanent image in chart),,    Narrative:  Start time: 01/25/2021 7:15 AM End time: 01/25/2021 7:25 AM Injection made incrementally with aspirations every 5 mL.  Performed by: Personally  Anesthesiologist: Leonides Grills, Kostas  Additional Notes: Functioning IV was confirmed and monitors were applied.  A timeout was performed. Sterile prep, hand hygiene and sterile gloves were used. A 74mm 21ga Arrow echogenic stimulator needle was used. Negative aspiration and negative test dose prior to incremental administration of local anesthetic. The patient tolerated the procedure well.  Ultrasound guidance: relevent anatomy identified, needle position confirmed, local anesthetic spread visualized around nerve(s), vascular puncture avoided.  Image printed for medical record.

## 2021-01-25 NOTE — Anesthesia Postprocedure Evaluation (Signed)
Anesthesia Post Note  Patient: Steven Aguirre  Procedure(s) Performed: OPEN REDUCTION INTERNAL FIXATION (ORIF) ELBOW/OLECRANON FRACTURE (Left: Elbow) ULNAR COLLATERAL LIGAMENT REPAIR (Left: Elbow)     Patient location during evaluation: PACU Anesthesia Type: General and Regional Level of consciousness: awake Pain management: pain level controlled Vital Signs Assessment: post-procedure vital signs reviewed and stable Respiratory status: spontaneous breathing, nonlabored ventilation, respiratory function stable and patient connected to nasal cannula oxygen Cardiovascular status: blood pressure returned to baseline and stable Postop Assessment: no apparent nausea or vomiting Anesthetic complications: no   No notable events documented.  Last Vitals:  Vitals:   01/25/21 1051 01/25/21 1054  BP:    Pulse:  (P) 73  Resp:  (!) (P) 21  Temp: 36.6 C   SpO2: 94% (P) 95%    Last Pain:  Vitals:   01/25/21 1051  TempSrc:   PainSc: 0-No pain                 [name]

## 2021-01-25 NOTE — Discharge Instructions (Addendum)
Steven Aguirre, MPH Alfonse Alpers, PA-C [facility] [address] [phone] (tel)   [phone] (fax)   POST-OPERATIVE INSTRUCTIONS   WOUND CARE Please keep splint clean dry and intact until followup.  You may shower on Post-Op Day #2.  You must keep splint dry during this process and may find that a plastic bag taped around the extremity or alternatively a towel based bath may be a better option.   If you get your splint wet or if it is damaged please contact our clinic.  EXERCISES Due to your splint being in place you will not be able to bear weight through your extremity.    You may use a sling for comfort Please continue to work on range of motion of your fingers and stretch these multiple times a day to prevent stiffness. Please continue to ambulate and do not stay sitting or lying for too long. Perform foot and wrist pumps to assist in circulation.  FOLLOW-UP If you develop a Fever (>101.5), Redness or Drainage from the surgical incision site, please call our office to arrange for an evaluation. Please call the office to schedule a follow-up appointment for your incision check if you do not already have one, 7-10 days post-operatively.  REGIONAL ANESTHESIA (NERVE BLOCKS) The anesthesia team may have performed a nerve block for you if safe in the setting of your care.  This is a great tool used to minimize pain.  Typically the block may start wearing off overnight but the long acting medicine may last for 3-4 days.  The nerve block wearing off can be a challenging period but please utilize your as needed pain medications to try and manage this period.    POST-OP MEDICATIONS- Multimodal approach to pain control  In general your pain will be controlled with a combination of substances.  Prescriptions unless otherwise discussed are electronically sent to your pharmacy.  This is a carefully made plan we use to minimize narcotic use.       - Diclofenac - Anti-inflammatory medication taken on a scheduled basis  - Acetaminophen - Non-narcotic pain medicine taken on a scheduled basis   - Gabapentin - this is a non-narcotic medication to help with pain, take on a scheduled basis  - Oxycodone - This is a strong narcotic, to be used only on an "as needed" basis for SEVERE pain.             -           Zofran - take as needed for nausea   HELPFUL INFORMATION   If you had a block, it will wear off between 8-24 hrs postop typically.  This is period when your pain may go from nearly zero to the pain you would have had postop without the block.  This is an abrupt transition but nothing dangerous is happening.  You may take an extra dose of narcotic when this happens.   You may be more comfortable sleeping in a semi-seated position the first few nights following surgery.  Keep a pillow propped under the elbow and forearm for comfort.  If you have a recliner type of chair it might be beneficial.  If not that is fine too, but it would be helpful to sleep propped up with pillows behind your operated shoulder as well under your elbow and forearm.  This will reduce pulling on the suture lines.   When dressing, put your operative arm in the sleeve first.  When getting undressed, take your operative arm out last.  Loose fitting, button-down shirts are recommended.  Often in the first days after surgery you may be more comfortable keeping your operative arm under your shirt and not through the sleeve.   You may return to work/school in the next couple of days when you feel up to it.  Desk work and typing in the sling is fine.   We suggest you use the pain medication the first night prior to going to bed, in order to ease any pain when the anesthesia wears off. You should avoid taking pain medications on an empty stomach as it will make you nauseous.   You should wean off your narcotic medicines as soon as you are able.  Most patients will be off or  using minimal narcotics before their first postop appointment.    Do not drink alcoholic beverages or take illicit drugs when taking pain medications.   It is against the law to drive while taking narcotics.  In some states it is against the law to drive while your arm is in a sling.    Pain medication may make you constipated.  Below are a few solutions to try in this order:   - Decrease the amount of pain medication if you aren't having pain.   - Drink lots of decaffeinated fluids.   - Drink prune juice and/or each dried prunes   If the first 3 don't work start with additional solutions   - Take Colace - an over-the-counter stool softener   - Take Senokot - an over-the-counter laxative   - Take Miralax - a stronger over-the-counter laxative   For more information including helpful videos and documents visit our website:   [email or web address]           Review of Systems   Physical Exam

## 2021-01-28 ENCOUNTER — Encounter (HOSPITAL_COMMUNITY): Payer: Self-pay | Admitting: Orthopaedic Surgery

## 2021-01-28 NOTE — Op Note (Signed)
Orthopaedic Surgery Operative Note (CSN: [id number])  Steven Aguirre  Oct 02, 1990 Date of Surgery: 01/25/2021   Diagnoses:  Left elbow proximal ulna fracture with lateral ulnar collateral ligament injury  Procedure: Left open reduction internal fixation of proximal ulna fracture Left lateral ligament repair   Operative Finding Successful completion of the planned procedure.  Patient's fracture was essentially extra-articular but it was comminuted.  We did use a interposition screw to hold my reduction but there was medial comminution.  We essentially used the locking tabs on the plate to buttress this comminution to the appropriate position.  Lateral ligaments were clearly avulsed and we placed a single anchor to repair this as well.  Post-operative plan: The patient will be nonweightbearing in a splint transition to a hinged elbow brace unlocked.  The patient will be discharged home.  DVT prophylaxis not indicated in this ambulatory upper extremity patient without significant risk factors.   Pain control with PRN pain medication preferring oral medicines.  Follow up plan will be scheduled in approximately 7 days for incision check and XR.  Post-Op Diagnosis: Same Surgeons:Primary: Bjorn Pippin, Deaunte Assistants:Caroline McBane PA-C Location: [facility] ROOM 10 Anesthesia: General with regional anesthesia Antibiotics: Ancef 2 g Tourniquet time:  Total Tourniquet Time Documented: Upper Arm (Left) - 71 minutes Total: Upper Arm (Left) - 71 minutes  Estimated Blood Loss: Minimal Complications: None Specimens: None Implants: Implant Name Type Inv. Item Serial No. Manufacturer Lot No. LRB No. Used Action  SCREW CORTICAL 2.7MM  - [id number] Screw SCREW CORTICAL 2.7MM   [employer]IM[employer](ORTH,TRAU,BIO,SG)  Left 1 Implanted  SCREW LOW PROFILE 22MMX3. - [id number] Screw SCREW LOW PROFILE 22MMX3.  [employer](ORTH,TRAU,BIO,SG)  Left 1 Implanted  ANCHOR JUGGERKNOT W/DRL 2/1.45 - [id number]  Anchor ANCHOR JUGGERKNOT W/DRL 2/1.45  [employer]IM[employer](ORTH,TRAU,BIO,SG) [id number] Left 1 Implanted  SCREW LOCK CORT STAR 3.5X20 - [id number] Screw SCREW LOCK CORT STAR 3.5X20  ZIMMER [employer]ON(ORTH,TRAU,BIO,SG)  Left 1 Implanted  WASHER 3.5MM - [id number] Orthopedic Implant WASHER 3.5MM  ZIMMER [employer]ON(ORTH,TRAU,BIO,SG)  Left 1 Implanted  PLATE OLECRANON LRG - [id number] Plate PLATE OLECRANON LRG  [employer]ON(ORTH,TRAU,BIO,SG)  Left 1 Implanted  SCREW CORTICAL LOW PROF 3.5X20 - [id number] Screw SCREW CORTICAL LOW PROF 3.5X20  ZIMMER [employer]ON(ORTH,TRAU,BIO,SG)  Left 2 Implanted  SCREW NON LOCKING LP 3.5 - [id number] Screw SCREW NON LOCKING LP 3.5  ZIMMER [employer]ON(ORTH,TRAU,BIO,SG)  Left 1 Implanted    Indications for Surgery:   Steven Aguirre is a 30 y.o. male with fall resulting in a complex elbow fracture with lateral ulnar collateral ligament injury.  Benefits and risks of operative and nonoperative management were discussed prior to surgery with patient/guardian(s) and informed consent form was completed.  Specific risks including infection, need for additional surgery, nonunion, malunion, heterotopic bone and stiffness amongst others   Procedure:   The patient was identified properly. Informed consent was obtained and the surgical site was marked. The patient was taken up to suite where general anesthesia was induced.  The patient was positioned lateral on a beanbag.  The left elbow was prepped and draped in the usual sterile fashion.  Timeout was performed before the beginning of the case.  Tourniquet was used for the above duration.  We began with a posterior approach to the elbow going over the olecranon.  Went through skin sharply achieving hemostasis progressed.  At that point identified made full-thickness skin flaps protecting the skin.  We were able to expose the fracture site which was palpable.  We stripped the bone minimally only at the fracture site.  We were able to note that there is  comminuted fracture fragments and significant comminution medially on an intercalary piece.  We felt that an interposition screw would help reduce the fracture into a simpler form.  We were able to hold this reduced with a series of clamps and placed a interposition 2.7 screw.  We had appropriate fixation with this.  We then bridged our fracture site with a [employer]ps olecranon plate.  We had 3 screws distal to the fracture site and 4 proximal.  We had multiple locking screws proximal to the fracture site verifying that none of them perforated the joint surface on multiple fluoroscopic images.  We checked our final fluoroscopic images and were happy with our reduction.  We turned our attention to the lateral ulnar collateral ligament which was avulsed on x-ray.  We opened Kocher's interval and identified that the fracture fragments had indeed ruptured off the lateral side of the elbow.  We excised the small fragments and then placed a single juggernaut anchor of which 1 limb was run in a locking Krakw fashion through the lateral ulnar collateral ligament and was able to be pulled back to its origin and compressed in place with the anchor.  We then closed Kocher's interval with running 0 Vicryl after irrigating copiously.    We irrigated the wound copiously before placing local antibiotic as listed above.  We closed the incision in a multilayer fashion with absorbable suture.  Sterile dressing was placed.  Well molded well-padded posterior slab splint was placed.  Patient was awoken taken to PACU in stable condition.  Alfonse Alpers, PA-C, present and scrubbed throughout the case, critical for completion in a timely fashion, and for retraction, instrumentation, closure.

## 2021-02-14 ENCOUNTER — Ambulatory Visit: Payer: 59 | Attending: Physician Assistant

## 2021-02-14 ENCOUNTER — Other Ambulatory Visit: Payer: Self-pay

## 2021-02-14 DIAGNOSIS — M25522 Pain in left elbow: Secondary | ICD-10-CM | POA: Diagnosis not present

## 2021-02-14 DIAGNOSIS — M25622 Stiffness of left elbow, not elsewhere classified: Secondary | ICD-10-CM | POA: Diagnosis present

## 2021-02-14 DIAGNOSIS — M6281 Muscle weakness (generalized): Secondary | ICD-10-CM | POA: Diagnosis present

## 2021-02-14 NOTE — Therapy (Signed)
[facility] [address] [place], [place], [address] Phone: [phone]   Fax:  [phone]  Physical Therapy Evaluation  Patient Details  Name: Steven Aguirre MRN: [id number] Date of Birth: Mar 30, 1991 Referring Provider (PT): Vernetta Honey, [place]   Encounter Date: 02/14/2021   PT End of Session - 02/14/21 1735     Visit Number 1    Number of Visits 21    Date for PT Re-Evaluation 05/09/21    Authorization Type UHC - FOTO 6th and 10th    PT Start Time 1650    PT Stop Time 1730    PT Time Calculation (min) 40 min    Activity Tolerance Patient tolerated treatment well;No increased pain    Behavior During Therapy [facility] for tasks assessed/performed             History reviewed. No pertinent past medical history.  Past Surgical History:  Procedure Laterality Date   KNEE SURGERY Right    ORIF ELBOW FRACTURE Left 01/25/2021   Procedure: OPEN REDUCTION INTERNAL FIXATION (ORIF) ELBOW/OLECRANON FRACTURE;  Surgeon: Bjorn Pippin, Awab;  Location: [facility];  Service: Orthopedics;  Laterality: Left;   ULNAR COLLATERAL LIGAMENT REPAIR Left 01/25/2021   Procedure: ULNAR COLLATERAL LIGAMENT REPAIR;  Surgeon: Bjorn Pippin, Zachery;  Location: [facility];  Service: Orthopedics;  Laterality: Left;    There were no vitals filed for this visit.    Subjective Assessment - 02/14/21 1653     Subjective Pt presents to PT s/p L ORIF of proximal ulna and L lateral ligament repair on 01/25/21 preformed by Dr. Everardo Pacific after fall with resultant L proximal ulnar fx on 01/19/21. He has been in hinged brace since last f/u with [facility] office and notes pain has been steadily decreasing. He notes some significant swelling and limited ROM in his L hand/wrist since sugery. States that the [facility] office told him to work on L elbow flexion at last f/u visit. Pt is R hand dominant and has had a lot of difficulty with ADLs and other work activites since injury.    Pertinent  History L ORIF of proximal ulna and L lateral ligament repair on 01/25/21    Limitations House hold activities    Patient Stated Goals improve L elbow range and strength to improve functional ability with work, home, and community activities    Currently in Pain? No/denies    Pain Score 0-No pain   4/10 at worst in last week   Pain Location Elbow    Pain Orientation Left    Pain Type Surgical pain    Pain Onset 1 to 4 weeks ago    Pain Frequency Intermittent                [facility] PT Assessment - 02/14/21 0001       Assessment   Medical Diagnosis L ORIF Proximal ulna fx , L lateral ligament repair    Referring Provider (PT) Vernetta Honey, PA-C    Onset Date/Surgical Date 01/25/21    Hand Dominance Right    Next [facility] Visit 02/22/21      Precautions   Precaution Comments in locked hinged brace on L elbow      Restrictions   Weight Bearing Restrictions Yes    LUE Weight Bearing Non weight bearing      Balance Screen   Has the patient fallen in the past 6 months No    Has the patient had a decrease  in activity level because of a fear of falling?  No    Is the patient reluctant to leave their home because of a fear of falling?  No      Home Environment   Living Environment Private residence    Living Arrangements Spouse/significant other    Type of Home House    Home Access Level entry      Prior Function   Level of Independence Independent;Independent with basic ADLs      Observation/Other Assessments   Observations marked increase in L hand edema    Skin Integrity surgical incision site well healing    Focus on Therapeutic Outcomes (FOTO)  18% funciton; 57% predicted      Sensation   Light Touch Appears Intact      AROM   Overall AROM Comments R UE WNL    Left Elbow Flexion 92      Strength   Overall Strength Comments R UE WNL; L UE DNT                        Objective measurements completed on examination: See above findings.        [facility] Adult PT Treatment/Exercise - 02/14/21 0001       Exercises   Exercises Wrist;Elbow      Elbow Exercises   Elbow Flexion Limitations isometric x 10 - 5 sec hold L      Wrist Exercises   Wrist Flexion Limitations x 10    Wrist Extension Limitations x 10    Other wrist exercises wrist circles x 15    Other wrist exercises tennis ball squeeze x 10 - 5 sec hold                    PT Education - 02/14/21 1734     Education Details eval findings, HEP, POC    Person(s) Educated Patient    Methods Explanation;Demonstration;Handout    Comprehension Verbalized understanding;Returned demonstration              PT Short Term Goals - 02/14/21 1806       PT SHORT TERM GOAL #1   Title Pt will be compliant and knowledgeable with initial HEP for improved comfort and carryover    Baseline initial HEP given    Time 3    Period Weeks    Status New    Target Date 03/07/21               PT Long Term Goals - 02/14/21 1807       PT LONG TERM GOAL #1   Title Pt will improve FOTO function score to no less than 57% as a proxy for functional improvement    Baseline 18% function    Time 12    Period Weeks    Status New    Target Date 05/09/21      PT LONG TERM GOAL #2   Title Pt will improve L elbow AROM to [facility] in order to improve comfort and functional ability    Baseline see flowsheet    Time 12    Period Weeks    Status New    Target Date 05/09/21      PT LONG TERM GOAL #3   Title Pt will self report L UE pain 0/10 at worst for improved comfort and functional ability    Baseline 4/10 at worst    Time 12    Period  Weeks    Status New    Target Date 05/09/21                    Plan - 02/14/21 1800     Clinical Impression Statement Pt is a plesaant 30 y/o M who presents to PT s/p L ORIF of proximal ulna and L lateral ligament repair on 01/25/21. Physical findings are consistent with Brandon impression, as pt demonstrates pain, swelling, and limited  movement of L forearm. His FOTO function score of 18% indicates significant deficits in functional ability and shows he is operating below PLOF. He would benefit from skilled PT services working towards gradual progression of strength and ROM of L UE post surgery.    Examination-Activity Limitations Lift;Reach Overhead;Carry;Dressing;Hygiene/Grooming    Examination-Participation Restrictions Volunteer;Laundry;Community Activity;Occupation;Cleaning    Stability/Clinical Decision Making Stable/Uncomplicated    Clinical Decision Making Low    Rehab Potential Excellent    PT Frequency 2x / week    PT Duration 12 weeks    PT Treatment/Interventions ADLs/Self Care Home Management;Electrical Stimulation;Cryotherapy;Therapeutic activities;Therapeutic exercise;Neuromuscular re-education;Patient/family education;Manual techniques;Passive range of motion;Taping;Vasopneumatic Device    PT Next Visit Plan assess response to HEP, L hand swelling, progress as able    PT Home Exercise Plan Access Code: [email or web address]    Consulted and Agree with Plan of Care Patient             Patient will benefit from skilled therapeutic intervention in order to improve the following deficits and impairments:  Decreased activity tolerance, Decreased endurance, Decreased mobility, Decreased range of motion, Decreased strength, Hypomobility, Impaired UE functional use, Pain  Visit Diagnosis: Pain in left elbow - Plan: PT plan of care cert/re-cert  Stiffness of left elbow, not elsewhere classified - Plan: PT plan of care cert/re-cert  Muscle weakness (generalized) - Plan: PT plan of care cert/re-cert     Problem List There are no problems to display for this patient.   Eloy End, PT, DPT 02/14/21 6:14 PM  [facility] [address] [place], [place], [address] Phone: [phone]   Fax:  [phone]  Name: Steven Aguirre MRN: [id number] Date of Birth:  05-30-1991

## 2021-02-19 ENCOUNTER — Ambulatory Visit: Payer: 59

## 2021-02-19 ENCOUNTER — Other Ambulatory Visit: Payer: Self-pay

## 2021-02-19 DIAGNOSIS — M25622 Stiffness of left elbow, not elsewhere classified: Secondary | ICD-10-CM

## 2021-02-19 DIAGNOSIS — M6281 Muscle weakness (generalized): Secondary | ICD-10-CM

## 2021-02-19 DIAGNOSIS — M25522 Pain in left elbow: Secondary | ICD-10-CM

## 2021-02-20 NOTE — Therapy (Signed)
[facility] [address] [place], [place], [address] Phone: [phone]   Fax:  [phone]  Physical Therapy Treatment  Patient Details  Name: Steven Aguirre MRN: [id number] Date of Birth: 1990-08-24 Referring Provider (PT): Vernetta Honey, [place]   Encounter Date: 02/19/2021   PT End of Session - 02/20/21 1611     Visit Number 2    Number of Visits 21    Date for PT Re-Evaluation 05/09/21    Authorization Type UHC - FOTO 6th and 10th    PT Start Time 1655    PT Stop Time 1735    PT Time Calculation (min) 40 min    Activity Tolerance Patient tolerated treatment well;No increased pain    Behavior During Therapy [facility] for tasks assessed/performed             No past medical history on file.  Past Surgical History:  Procedure Laterality Date   KNEE SURGERY Right    ORIF ELBOW FRACTURE Left 01/25/2021   Procedure: OPEN REDUCTION INTERNAL FIXATION (ORIF) ELBOW/OLECRANON FRACTURE;  Surgeon: Bjorn Pippin, Amedio;  Location: [facility];  Service: Orthopedics;  Laterality: Left;   ULNAR COLLATERAL LIGAMENT REPAIR Left 01/25/2021   Procedure: ULNAR COLLATERAL LIGAMENT REPAIR;  Surgeon: Bjorn Pippin, Deigo;  Location: [facility];  Service: Orthopedics;  Laterality: Left;    There were no vitals filed for this visit.   Subjective Assessment - 02/20/21 1604     Subjective Pt presents to PT with no current reports of L elbow/forearm pain. He has been compliant with initial HEP with no adverse effects noted. Pt has f/u visit with [facility] on 02/22/21. He is ready to begin PT treatment at this time.    Pertinent History L ORIF of proximal ulna and L lateral ligament repair on 01/25/21    Currently in Pain? No/denies    Pain Score 0-No pain                               [facility] Adult PT Treatment/Exercise - 02/20/21 0001       Elbow Exercises   Elbow Flexion Limitations isometric 2x10 - 5 sec hold    Elbow Extension  Limitations isometric 2x10 - 5 sec hold    Other elbow exercises AAROM flex/ext in range of 30-100 degrees L      Shoulder Exercises: Isometric Strengthening   Flexion Limitations 2x10 - 5 sec hold L UE    ABduction Limitations 2x10 - 5 sec hold L UE      Wrist Exercises   Wrist Flexion Limitations x 30 AROM    Wrist Extension Limitations x 30 AROM    Wrist Ulnar Deviation Limitations x 20 AROM    Other wrist exercises putty squeeze for 2 min      Manual Therapy   Manual therapy comments STM working distal to proximal to L hand for pt education on decreasing swelling                      PT Short Term Goals - 02/14/21 1806       PT SHORT TERM GOAL #1   Title Pt will be compliant and knowledgeable with initial HEP for improved comfort and carryover    Baseline initial HEP given    Time 3    Period Weeks    Status New    Target Date 03/07/21  PT Long Term Goals - 02/14/21 1807       PT LONG TERM GOAL #1   Title Pt will improve FOTO function score to no less than 57% as a proxy for functional improvement    Baseline 18% function    Time 12    Period Weeks    Status New    Target Date 05/09/21      PT LONG TERM GOAL #2   Title Pt will improve L elbow AROM to [employer] in order to improve comfort and functional ability    Baseline see flowsheet    Time 12    Period Weeks    Status New    Target Date 05/09/21      PT LONG TERM GOAL #3   Title Pt will self report L UE pain 0/10 at worst for improved comfort and functional ability    Baseline 4/10 at worst    Time 12    Period Weeks    Status New    Target Date 05/09/21                   Plan - 02/20/21 1611     Clinical Impression Statement Pt tolerated treatment well, but did have slight increase in pain when initially unlocking hinged brace to work through [employer] as he had not yet stabilized his L forearm with opposite hand. Otheriwse, exercises progressed per Forrestine Him and  [employer] repair protocol with good tolerance and form. PT issued putty for grip strengthening to L hand along with STM education in order to work towards continued decrease in swelling. Will continue to progress as tolerated per POC.    PT Treatment/Interventions ADLs/Self Care Home Management;Electrical Stimulation;Cryotherapy;Therapeutic activities;Therapeutic exercise;Neuromuscular re-education;Patient/family education;Manual techniques;Passive range of motion;Taping;Vasopneumatic Device    PT Next Visit Plan assess response to HEP, L hand swelling, progress as able    PT Home Exercise Plan Access Code: [email or web address]             Patient will benefit from skilled therapeutic intervention in order to improve the following deficits and impairments:  Decreased activity tolerance, Decreased endurance, Decreased mobility, Decreased range of motion, Decreased strength, Hypomobility, Impaired UE functional use, Pain  Visit Diagnosis: Pain in left elbow  Stiffness of left elbow, not elsewhere classified  Muscle weakness (generalized)     Problem List There are no problems to display for this patient.   Eloy End, PT, DPT 02/20/21 4:17 PM  [facility] [address] [place], [place], [address] Phone: [phone]   Fax:  [phone]  Name: [name] MRN: [id number] Date of Birth: [birth year]

## 2021-02-21 ENCOUNTER — Ambulatory Visit: Payer: 59

## 2021-02-21 ENCOUNTER — Other Ambulatory Visit: Payer: Self-pay

## 2021-02-21 DIAGNOSIS — M25522 Pain in left elbow: Secondary | ICD-10-CM

## 2021-02-21 DIAGNOSIS — M25622 Stiffness of left elbow, not elsewhere classified: Secondary | ICD-10-CM

## 2021-02-21 DIAGNOSIS — M6281 Muscle weakness (generalized): Secondary | ICD-10-CM

## 2021-02-21 NOTE — Therapy (Signed)
[facility] Outpatient Rehabilitation [facility] [address] [place], [place], [address] Phone: [phone]   Fax:  [phone]  Physical Therapy Treatment  Patient Details  Name: Steven Aguirre MRN: [id number] Date of Birth: [birth year] Referring Provider (PT): Vernetta Honey, [place]   Encounter Date: 02/21/2021   PT End of Session - 02/21/21 1741     Visit Number 3    Number of Visits 21    Date for PT Re-Evaluation 05/09/21    Authorization Type UHC - FOTO 6th and 10th    PT Start Time 1745    PT Stop Time 1820    PT Time Calculation (min) 35 min    Activity Tolerance Patient tolerated treatment well;No increased pain    Behavior During Therapy [facility] for tasks assessed/performed             No past medical history on file.  Past Surgical History:  Procedure Laterality Date   KNEE SURGERY Right    ORIF ELBOW FRACTURE Left 01/25/2021   Procedure: OPEN REDUCTION INTERNAL FIXATION (ORIF) ELBOW/OLECRANON FRACTURE;  Surgeon: Bjorn Pippin, Korey;  Location: [facility];  Service: Orthopedics;  Laterality: Left;   ULNAR COLLATERAL LIGAMENT REPAIR Left 01/25/2021   Procedure: ULNAR COLLATERAL LIGAMENT REPAIR;  Surgeon: Bjorn Pippin, Klay;  Location: [facility];  Service: Orthopedics;  Laterality: Left;    There were no vitals filed for this visit.   Subjective Assessment - 02/21/21 1741     Subjective Pt presents to PT with no current reports of L elbow/forearm pain. He has been compliant with his HEP with no adverse effect. He is ready to begin PT treatment at this time.    Currently in Pain? No/denies    Pain Score 0-No pain                               [facility] Adult PT Treatment/Exercise - 02/21/21 0001       Elbow Exercises   Elbow Flexion Limitations isometric 3x10 - 5 sec hold    Elbow Extension Limitations isometric 3x10 - 5 sec hold    Other elbow exercises elbow flex/ext AROM 30-100 degrees 3x10      Shoulder Exercises: Isometric  Strengthening   Flexion Limitations x10 - 5 sec hold L UE    External Rotation Limitations x 10 - 5 sec hold    Internal Rotation Limitations x 10 - 5 sec hold    ABduction Limitations x10 - 5 sec hold L UE      Wrist Exercises   Wrist Flexion Limitations x 30 AROM    Wrist Extension Limitations x 30 AROM    Other wrist exercises wrist circles x 30 ea    Other wrist exercises wrist alt flex/ext using yellow rubber resistance 2 x 60 sec                      PT Short Term Goals - 02/14/21 1806       PT SHORT TERM GOAL #1   Title Pt will be compliant and knowledgeable with initial HEP for improved comfort and carryover    Baseline initial HEP given    Time 3    Period Weeks    Status New    Target Date 03/07/21               PT Long Term Goals - 02/14/21 1807  PT LONG TERM GOAL #1   Title Pt will improve FOTO function score to no less than 57% as a proxy for functional improvement    Baseline 18% function    Time 12    Period Weeks    Status New    Target Date 05/09/21      PT LONG TERM GOAL #2   Title Pt will improve L elbow AROM to [facility] in order to improve comfort and functional ability    Baseline see flowsheet    Time 12    Period Weeks    Status New    Target Date 05/09/21      PT LONG TERM GOAL #3   Title Pt will self report L UE pain 0/10 at worst for improved comfort and functional ability    Baseline 4/10 at worst    Time 12    Period Weeks    Status New    Target Date 05/09/21                   Plan - 02/21/21 2136     Clinical Impression Statement Pt was able to complete prescribed exercises with no adverse effect or increase in pain. Again peformed isometric exercises and AAROM and AROM per protocol. Will continue to progress per protocol as tolerated.    PT Treatment/Interventions ADLs/Self Care Home Management;Electrical Stimulation;Cryotherapy;Therapeutic activities;Therapeutic exercise;Neuromuscular  re-education;Patient/family education;Manual techniques;Passive range of motion;Taping;Vasopneumatic Device    PT Next Visit Plan assess response to HEP, L hand swelling, progress as able    PT Home Exercise Plan Access Code: [email or web address]             Patient will benefit from skilled therapeutic intervention in order to improve the following deficits and impairments:  Decreased activity tolerance, Decreased endurance, Decreased mobility, Decreased range of motion, Decreased strength, Hypomobility, Impaired UE functional use, Pain  Visit Diagnosis: Pain in left elbow  Stiffness of left elbow, not elsewhere classified  Muscle weakness (generalized)     Problem List There are no problems to display for this patient.   Eloy End, PT, DPT 02/21/21 9:38 PM   [facility] [address] [place], [place], [address] Phone: [phone]   Fax:  [phone]  Name: Hondo Domanique Luckett MRN: [id number] Date of Birth: 1990/08/16

## 2021-02-28 ENCOUNTER — Ambulatory Visit: Payer: 59

## 2021-02-28 ENCOUNTER — Other Ambulatory Visit: Payer: Self-pay

## 2021-02-28 DIAGNOSIS — M25522 Pain in left elbow: Secondary | ICD-10-CM

## 2021-02-28 DIAGNOSIS — M6281 Muscle weakness (generalized): Secondary | ICD-10-CM

## 2021-02-28 DIAGNOSIS — M25622 Stiffness of left elbow, not elsewhere classified: Secondary | ICD-10-CM

## 2021-02-28 NOTE — Therapy (Signed)
[facility] Outpatient Rehabilitation [facility] [address] [place], [place], [address] Phone: [phone]   Fax:  [phone]  Physical Therapy Treatment  Patient Details  Name: Steven Aguirre MRN: [id number] Date of Birth: 12-26-1990 Referring Provider (PT): Vernetta Honey, [place]   Encounter Date: 02/28/2021   PT End of Session - 02/28/21 1614     Number of Visits 21    Date for PT Re-Evaluation 05/09/21    Authorization Type UHC - FOTO 6th and 10th    PT Start Time 1615    PT Stop Time 1655    PT Time Calculation (min) 40 min    Activity Tolerance Patient tolerated treatment well;No increased pain    Behavior During Therapy [facility] for tasks assessed/performed             No past medical history on file.  Past Surgical History:  Procedure Laterality Date   KNEE SURGERY Right    ORIF ELBOW FRACTURE Left 01/25/2021   Procedure: OPEN REDUCTION INTERNAL FIXATION (ORIF) ELBOW/OLECRANON FRACTURE;  Surgeon: Bjorn Pippin, Geronimo;  Location: [facility];  Service: Orthopedics;  Laterality: Left;   ULNAR COLLATERAL LIGAMENT REPAIR Left 01/25/2021   Procedure: ULNAR COLLATERAL LIGAMENT REPAIR;  Surgeon: Bjorn Pippin, Kaiyu;  [facility]: [facility];  Service: Orthopedics;  Laterality: Left;    There were no vitals filed for this visit.   Subjective Assessment - 02/28/21 1615     Subjective Pt presents to PT with no current reports of pain. He has been compliant with his HEP with no adverse effect. Pt had positive f/u with [facility] office and has new precautions listed in flowsheet. He can is cleared for out of brace activity on 03/08/21. Pt is ready to begin PT treatment at this time.    Pertinent History L ORIF of proximal ulna and L lateral ligament repair on 01/25/21; **updated precautions: ROM as tolerated, <5lb lifting restriction, no valgus stress across elbow, brace until 03/08/21 then discontinue, hand therapy for stiffness and swelling**    Currently in Pain? No/denies    Pain  Score 0-No pain                [facility] PT Assessment - 02/28/21 0001       Precautions   Precaution Comments updated precautions: ROM as tolerated, <5lb lifting restriction, no valgus stress across elbow, brace until 03/08/21 then discontinue, hand therapy for stiffness and swelling                           [facility] Adult PT Treatment/Exercise - 02/28/21 0001       Exercises   Exercises Shoulder      Elbow Exercises   Elbow Flexion Limitations hammer curl 3x10 1lb    Elbow Extension Limitations 3x10 AROM      Shoulder Exercises: Standing   Row Limitations low row 3x10 green tband      Wrist Exercises   Other wrist exercises putty squeeze x 3 min    Other wrist exercises wrist flex/ext using 2lb DB 2x10      Manual Therapy   Manual therapy comments STM to L hand working proximal to distal to reduce swelling                    PT Education - 02/28/21 1705     Education Details HEP    Person(s) Educated Patient    Methods Demonstration;Explanation;Handout  Comprehension Verbalized understanding;Returned demonstration              PT Short Term Goals - 02/14/21 1806       PT SHORT TERM GOAL #1   Title Pt will be compliant and knowledgeable with initial HEP for improved comfort and carryover    Baseline initial HEP given    Time 3    Period Weeks    Status New    Target Date 03/07/21               PT Long Term Goals - 02/14/21 1807       PT LONG TERM GOAL #1   Title Pt will improve FOTO function score to no less than 57% as a proxy for functional improvement    Baseline 18% function    Time 12    Period Weeks    Status New    Target Date 05/09/21      PT LONG TERM GOAL #2   Title Pt will improve L elbow AROM to [facility] in order to improve comfort and functional ability    Baseline see flowsheet    Time 12    Period Weeks    Status New    Target Date 05/09/21      PT LONG TERM GOAL #3   Title Pt will self report L  UE pain 0/10 at worst for improved comfort and functional ability    Baseline 4/10 at worst    Time 12    Period Weeks    Status New    Target Date 05/09/21                   Plan - 02/28/21 1705     Clinical Impression Statement Pt was able to complete prescribed exercises with no adverse effect or increase in pain. He was able to progress per protocol and is doing well overall with therapy. R hand demonstrated decrease in edema compared to initial evaluation. Pt continues to benefit from skilled PT services and should continue to be seen and progressed per protocol.    PT Treatment/Interventions ADLs/Self Care Home Management;Electrical Stimulation;Cryotherapy;Therapeutic activities;Therapeutic exercise;Neuromuscular re-education;Patient/family education;Manual techniques;Passive range of motion;Taping;Vasopneumatic Device    PT Next Visit Plan assess response to HEP, L hand swelling, progress as able    PT Home Exercise Plan Access Code: [email or web address]             Patient will benefit from skilled therapeutic intervention in order to improve the following deficits and impairments:  Decreased activity tolerance, Decreased endurance, Decreased mobility, Decreased range of motion, Decreased strength, Hypomobility, Impaired UE functional use, Pain  Visit Diagnosis: Pain in left elbow  Stiffness of left elbow, not elsewhere classified  Muscle weakness (generalized)     Problem List There are no problems to display for this patient.   Eloy End, PT, DPT 02/28/21 5:51 PM  [facility] [address] [place], [place], [address] Phone: [phone]   Fax:  [phone]  Name: Steven Aguirre MRN: [id number] Date of Birth: 11-07-1990

## 2021-03-06 ENCOUNTER — Other Ambulatory Visit: Payer: Self-pay

## 2021-03-06 ENCOUNTER — Encounter: Payer: Self-pay | Admitting: Physical Therapy

## 2021-03-06 ENCOUNTER — Ambulatory Visit: Payer: 59 | Attending: Physician Assistant | Admitting: Physical Therapy

## 2021-03-06 DIAGNOSIS — M25522 Pain in left elbow: Secondary | ICD-10-CM | POA: Insufficient documentation

## 2021-03-06 DIAGNOSIS — M6281 Muscle weakness (generalized): Secondary | ICD-10-CM | POA: Insufficient documentation

## 2021-03-06 DIAGNOSIS — M25622 Stiffness of left elbow, not elsewhere classified: Secondary | ICD-10-CM | POA: Diagnosis present

## 2021-03-06 NOTE — Therapy (Signed)
[facility] Outpatient Rehabilitation [facility] [address] [place], [place], [address] Phone: [phone]   Fax:  [phone]  Physical Therapy Treatment  Patient Details  Name: Steven Aguirre MRN: [id number] Date of Birth: 07-31-1991 Referring Provider (PT): Vernetta Honey, [place]   Encounter Date: 03/06/2021   PT End of Session - 03/06/21 1744     Visit Number 5    Number of Visits 21    Date for PT Re-Evaluation 05/09/21    Authorization Type UHC - FOTO 6th and 10th    PT Start Time 1745    PT Stop Time 1828    PT Time Calculation (min) 43 min    Activity Tolerance Patient tolerated treatment well;No increased pain    Behavior During Therapy [facility] for tasks assessed/performed             History reviewed. No pertinent past medical history.  Past Surgical History:  Procedure Laterality Date   KNEE SURGERY Right    ORIF ELBOW FRACTURE Left 01/25/2021   Procedure: OPEN REDUCTION INTERNAL FIXATION (ORIF) ELBOW/OLECRANON FRACTURE;  Surgeon: Bjorn Pippin, Torre;  Location: [facility];  Service: Orthopedics;  Laterality: Left;   ULNAR COLLATERAL LIGAMENT REPAIR Left 01/25/2021   Procedure: ULNAR COLLATERAL LIGAMENT REPAIR;  Surgeon: Bjorn Pippin, Jebadiah;  Location: [facility];  Service: Orthopedics;  Laterality: Left;    There were no vitals filed for this visit.     Exercises    Exercises Shoulder         Elbow Exercises    Elbow Flexion Limitations hammer curl 4x10 4lb    Elbow Extension Limitations 3x10 AROM         Shoulder Exercises: Standing    Row Limitations low row 4x10 green tband         Wrist Exercises    Other wrist exercises putty squeeze x 4 min    Other wrist exercises wrist flex/ext using 2lb DB x10         Manual Therapy    Manual therapy comments STM to L hand working proximal to distal to reduce swelling       PT Short Term Goals - 02/14/21 1806       PT SHORT TERM GOAL #1   Title Pt will be compliant and knowledgeable with  initial HEP for improved comfort and carryover    Baseline initial HEP given    Time 3    Period Weeks    Status New    Target Date 03/07/21               PT Long Term Goals - 02/14/21 1807       PT LONG TERM GOAL #1   Title Pt will improve FOTO function score to no less than 57% as a proxy for functional improvement    Baseline 18% function    Time 12    Period Weeks    Status New    Target Date 05/09/21      PT LONG TERM GOAL #2   Title Pt will improve L elbow AROM to [facility] in order to improve comfort and functional ability    Baseline see flowsheet    Time 12    Period Weeks    Status New    Target Date 05/09/21      PT LONG TERM GOAL #3   Title Pt will self report L UE pain 0/10 at worst for improved comfort and functional ability  Baseline 4/10 at worst    Time 12    Period Weeks    Status New    Target Date 05/09/21                   Plan - 03/06/21 1756     Clinical Impression Statement Pt reports no increase in baseline pain following therapy  HEP was reviewed, but left unchanged    Overall, Steven Aguirre is progressing well with therapy.  Today we concentrated on  elbow, wrist, and periscapular strengthening .  Pt tolerates session well.  Next session he will begin to do light activity out of brace.  Pt will continue to benefit from skilled physical therapy to address remaining deficits and achieve listed goals.  Continue per POC.    PT Treatment/Interventions ADLs/Self Care Home Management;Electrical Stimulation;Cryotherapy;Therapeutic activities;Therapeutic exercise;Neuromuscular re-education;Patient/family education;Manual techniques;Passive range of motion;Taping;Vasopneumatic Device    PT Next Visit Plan assess response to HEP, L hand swelling, progress as able    PT Home Exercise Plan Access Code: [email or web address]             Patient will benefit from skilled therapeutic intervention in order to improve the following deficits and  impairments:  Decreased activity tolerance, Decreased endurance, Decreased mobility, Decreased range of motion, Decreased strength, Hypomobility, Impaired UE functional use, Pain  Visit Diagnosis: Pain in left elbow  Stiffness of left elbow, not elsewhere classified  Muscle weakness (generalized)     Problem List There are no problems to display for this patient.  Alphonzo Severance PT, DPT 03/06/21 6:29 PM  [facility] Outpatient Rehabilitation [facility] [address] [place], [place], [address] Phone: [phone]   Fax:  [phone]  Name: Steven Aguirre MRN: [id number] Date of Birth: 08-Jan-1991

## 2021-03-08 ENCOUNTER — Other Ambulatory Visit: Payer: Self-pay

## 2021-03-08 ENCOUNTER — Ambulatory Visit: Payer: 59

## 2021-03-08 DIAGNOSIS — M25522 Pain in left elbow: Secondary | ICD-10-CM

## 2021-03-08 DIAGNOSIS — M6281 Muscle weakness (generalized): Secondary | ICD-10-CM

## 2021-03-08 DIAGNOSIS — M25622 Stiffness of left elbow, not elsewhere classified: Secondary | ICD-10-CM

## 2021-03-08 NOTE — Therapy (Signed)
[facility] Outpatient Rehabilitation [facility] [address] [place], [place], [address] Phone: [phone]   Fax:  [phone]  Physical Therapy Treatment  Patient Details  Name: Steven Aguirre MRN: [id number] Date of Birth: Sep 12, 1990 Referring Provider (PT): Vernetta Honey, [place]   Encounter Date: 03/08/2021   PT End of Session - 03/08/21 1616     Visit Number 6    Number of Visits 21    Date for PT Re-Evaluation 05/09/21    Authorization Type UHC - FOTO 6th and 10th    PT Start Time 1616    Activity Tolerance Patient tolerated treatment well;No increased pain    Behavior During Therapy [facility] for tasks assessed/performed             No past medical history on file.  Past Surgical History:  Procedure Laterality Date   KNEE SURGERY Right    ORIF ELBOW FRACTURE Left 01/25/2021   Procedure: OPEN REDUCTION INTERNAL FIXATION (ORIF) ELBOW/OLECRANON FRACTURE;  Surgeon: Bjorn Pippin, Junius;  [facility]: [facility];  Service: Orthopedics;  Laterality: Left;   ULNAR COLLATERAL LIGAMENT REPAIR Left 01/25/2021   Procedure: ULNAR COLLATERAL LIGAMENT REPAIR;  Surgeon: Bjorn Pippin, Kacper;  Location: [facility];  Service: Orthopedics;  Laterality: Left;    There were no vitals filed for this visit.   Subjective Assessment - 03/08/21 1616     Subjective Pt presents to PT with no curent reports of pain or discomfort. He has been compliant with HEP with no adverse effect. He is able to be out of the brace today per Akira last visit. Pt is ready to begin PT treatment at this time.    Currently in Pain? No/denies    Pain Score 0-No pain                               [facility] Adult PT Treatment/Exercise - 03/08/21 0001       Elbow Exercises   Other elbow exercises 2x15 elbow flex/ext AAROM    Other elbow exercises low load long duration stretch L elbow ext x 2 min      Shoulder Exercises: Standing   Row Limitations low row 3x15 green tband      Wrist  Exercises   Other wrist exercises pronation/supination AROM 2x20    Other wrist exercises wrist flex/ext using 2lb DB 2x15      Manual Therapy   Manual therapy comments STM to L hand working proximal to distal to reduce swelling                      PT Short Term Goals - 02/14/21 1806       PT SHORT TERM GOAL #1   Title Pt will be compliant and knowledgeable with initial HEP for improved comfort and carryover    Baseline initial HEP given    Time 3    Period Weeks    Status New    Target Date 03/07/21               PT Long Term Goals - 02/14/21 1807       PT LONG TERM GOAL #1   Title Pt will improve FOTO function score to no less than 57% as a proxy for functional improvement    Baseline 18% function    Time 12    Period Weeks    Status New    Target  Date 05/09/21      PT LONG TERM GOAL #2   Title Pt will improve L elbow AROM to [facility] in order to improve comfort and functional ability    Baseline see flowsheet    Time 12    Period Weeks    Status New    Target Date 05/09/21      PT LONG TERM GOAL #3   Title Pt will self report L UE pain 0/10 at worst for improved comfort and functional ability    Baseline 4/10 at worst    Time 12    Period Weeks    Status New    Target Date 05/09/21                   Plan - 03/08/21 1711     Clinical Impression Statement Pt was able to complete prescribed exericses with no adverse effect, with exception of long duration stretch for L elbow ext. He generally is progressing well with therapy but does have reduced ROM for L elbow post injury/surgery. Additoinal appts added with HEP updated for upcoming travel and vacation. PT will continue to progress as tolerated per POC.    PT Treatment/Interventions ADLs/Self Care Home Management;Electrical Stimulation;Cryotherapy;Therapeutic activities;Therapeutic exercise;Neuromuscular re-education;Patient/family education;Manual techniques;Passive range of  motion;Taping;Vasopneumatic Device    PT Next Visit Plan assess response to HEP, L hand swelling, progress as able    PT Home Exercise Plan Access Code: [email or web address]             Patient will benefit from skilled therapeutic intervention in order to improve the following deficits and impairments:  Decreased activity tolerance, Decreased endurance, Decreased mobility, Decreased range of motion, Decreased strength, Hypomobility, Impaired UE functional use, Pain  Visit Diagnosis: Pain in left elbow  Stiffness of left elbow, not elsewhere classified  Muscle weakness (generalized)     Problem List There are no problems to display for this patient.   Eloy End, PT, DPT 03/08/21 5:13 PM  [facility] Outpatient Rehabilitation [facility] [address] [place], [place], [address] Phone: [phone]   Fax:  [phone]  Name: Steven Aguirre MRN: [id number] Date of Birth: 12/02/1990

## 2021-03-22 ENCOUNTER — Other Ambulatory Visit: Payer: Self-pay

## 2021-03-22 ENCOUNTER — Encounter: Payer: Self-pay | Admitting: Physical Therapy

## 2021-03-22 ENCOUNTER — Ambulatory Visit: Payer: 59 | Admitting: Physical Therapy

## 2021-03-22 DIAGNOSIS — M25522 Pain in left elbow: Secondary | ICD-10-CM

## 2021-03-22 DIAGNOSIS — M6281 Muscle weakness (generalized): Secondary | ICD-10-CM

## 2021-03-22 DIAGNOSIS — M25622 Stiffness of left elbow, not elsewhere classified: Secondary | ICD-10-CM

## 2021-03-22 NOTE — Therapy (Signed)
[facility] Outpatient Rehabilitation [facility] [address] [place], [place], [address] Phone: [phone]   Fax:  [phone]  Physical Therapy Treatment  Patient Details  Name: Steven Aguirre MRN: [id number] Date of Birth: 1991/04/06 Referring Provider (PT): Vernetta Honey, [place]   Encounter Date: 03/22/2021   PT End of Session - 03/22/21 1829     Visit Number 7    Number of Visits 21    Date for PT Re-Evaluation 05/09/21    Authorization Type UHC - FOTO 6th and 10th    PT Start Time 1830    PT Stop Time 1910    PT Time Calculation (min) 40 min    Activity Tolerance Patient tolerated treatment well;No increased pain    Behavior During Therapy [facility] for tasks assessed/performed             History reviewed. No pertinent past medical history.  Past Surgical History:  Procedure Laterality Date   KNEE SURGERY Right    ORIF ELBOW FRACTURE Left 01/25/2021   Procedure: OPEN REDUCTION INTERNAL FIXATION (ORIF) ELBOW/OLECRANON FRACTURE;  Surgeon: Bjorn Pippin, Brysin;  Location: [facility];  Service: Orthopedics;  Laterality: Left;   ULNAR COLLATERAL LIGAMENT REPAIR Left 01/25/2021   Procedure: ULNAR COLLATERAL LIGAMENT REPAIR;  Surgeon: Bjorn Pippin, Macauley;  Location: [facility];  Service: Orthopedics;  Laterality: Left;    There were no vitals filed for this visit.   Subjective Assessment - 03/22/21 1833     Subjective Pt reports that he has no pain currently.  He says he may have to get a brace to help straighten out his elbow.  He reports that his Lawsen told him he could increase his activity slowly and lift more than 5 lbs as tolerated.             [facility] Adult PT Treatment/Exercise:  Therapeutic Exercise: - UBE - L1 - 5 min forward - Row - GTB - 3x15 - Shoulder ext - YTB - 3x10 - ER isometric walkout 2x10 ea - YTB - IR isometric walkout 2x10 ea - RTB - AROM flexion/ext of elbow - 2x10 - pronation/supination - 2# - 2x10 - wrist flexion/ext - 2 lb -  2x10   PT Short Term Goals - 02/14/21 1806       PT SHORT TERM GOAL #1   Title Pt will be compliant and knowledgeable with initial HEP for improved comfort and carryover    Baseline initial HEP given    Time 3    Period Weeks    Status New    Target Date 03/07/21               PT Long Term Goals - 02/14/21 1807       PT LONG TERM GOAL #1   Title Pt will improve FOTO function score to no less than 57% as a proxy for functional improvement    Baseline 18% function    Time 12    Period Weeks    Status New    Target Date 05/09/21      PT LONG TERM GOAL #2   Title Pt will improve L elbow AROM to [facility] in order to improve comfort and functional ability    Baseline see flowsheet    Time 12    Period Weeks    Status New    Target Date 05/09/21      PT LONG TERM GOAL #3   Title Pt will self report L UE  pain 0/10 at worst for improved comfort and functional ability    Baseline 4/10 at worst    Time 12    Period Weeks    Status New    Target Date 05/09/21                   Plan - 03/22/21 1853     Clinical Impression Statement Pt doing well.  Innitiated light ER strengthening today.  Significant strength deficit in L UE and shoulder (as expected).  We reviewed LLLD stretching today and I encouraged him to stretch for at least 30 min/day.  He confirms undertanding.    PT Treatment/Interventions ADLs/Self Care Home Management;Electrical Stimulation;Cryotherapy;Therapeutic activities;Therapeutic exercise;Neuromuscular re-education;Patient/family education;Manual techniques;Passive range of motion;Taping;Vasopneumatic Device    PT Next Visit Plan assess response to HEP, L hand swelling, progress as able    PT Home Exercise Plan Access Code: [email or web address]             Patient will benefit from skilled therapeutic intervention in order to improve the following deficits and impairments:  Decreased activity tolerance, Decreased endurance, Decreased mobility, Decreased  range of motion, Decreased strength, Hypomobility, Impaired UE functional use, Pain  Visit Diagnosis: Pain in left elbow  Stiffness of left elbow, not elsewhere classified  Muscle weakness (generalized)     Problem List There are no problems to display for this patient.   Alphonzo Severance PT, DPT 03/22/21 7:11 PM  [facility] Outpatient Rehabilitation [facility] [address] [place], [place], [address] Phone: [phone]   Fax:  [phone]  Name: Steven Aguirre MRN: [id number] Date of Birth: 09/13/1990

## 2021-03-29 ENCOUNTER — Other Ambulatory Visit: Payer: Self-pay

## 2021-03-29 ENCOUNTER — Ambulatory Visit: Payer: 59

## 2021-03-29 DIAGNOSIS — M25622 Stiffness of left elbow, not elsewhere classified: Secondary | ICD-10-CM

## 2021-03-29 DIAGNOSIS — M25522 Pain in left elbow: Secondary | ICD-10-CM

## 2021-03-29 DIAGNOSIS — M6281 Muscle weakness (generalized): Secondary | ICD-10-CM

## 2021-03-29 NOTE — Therapy (Signed)
[facility] Outpatient Rehabilitation [facility] [address] [place], [place], [address] Phone: [phone]   Fax:  [phone]  Physical Therapy Treatment  Patient Details  Name: Steven Aguirre MRN: [id number] Date of Birth: 09-09-1990 Referring Provider (PT): Vernetta Honey, [place]   Encounter Date: 03/29/2021   PT End of Session - 03/29/21 1826     Visit Number 8    Number of Visits 21    Date for PT Re-Evaluation 05/09/21    Authorization Type UHC - FOTO 6th and 10th    PT Start Time 1830    PT Stop Time 1910    PT Time Calculation (min) 40 min    Activity Tolerance Patient tolerated treatment well;No increased pain    Behavior During Therapy [facility] for tasks assessed/performed             No past medical history on file.  Past Surgical History:  Procedure Laterality Date   KNEE SURGERY Right    ORIF ELBOW FRACTURE Left 01/25/2021   Procedure: OPEN REDUCTION INTERNAL FIXATION (ORIF) ELBOW/OLECRANON FRACTURE;  Surgeon: Bjorn Pippin, Demitrius;  [facility];  Service: Orthopedics;  Laterality: Left;   ULNAR COLLATERAL LIGAMENT REPAIR Left 01/25/2021   Procedure: ULNAR COLLATERAL LIGAMENT REPAIR;  Surgeon: Bjorn Pippin, Ann;  [facility];  Service: Orthopedics;  Laterality: Left;    There were no vitals filed for this visit.   Subjective Assessment - 03/29/21 1826     Subjective Pt presents to PT with no current pain. He has been compliant with HEP with no adverse effects. Pt is ready to begin PT at this time.    Currently in Pain? No/denies    Pain Score 0-No pain           Therapeutic Exercise: - UBE - L1 - 4 min forward - Row - Black TB - 3x15 - Shoulder ext - YTB - 3x10 - ER isometric walkout 2x10 ea - YTB - IR isometric walkout 2x10 ea - RTB - eccentric lowering 4lb 3x10  - AROM flexion/ext of elbow - 2x10 - pronation/supination - 2# - 2x10 - wrist flexion/ext - 2 lb - 2x10 (not today) - farmer's carry 5lb L hand x  562ft                              PT Short Term Goals - 02/14/21 1806       PT SHORT TERM GOAL #1   Title Pt will be compliant and knowledgeable with initial HEP for improved comfort and carryover    Baseline initial HEP given    Time 3    Period Weeks    Status New    Target Date 03/07/21               PT Long Term Goals - 02/14/21 1807       PT LONG TERM GOAL #1   Title Pt will improve FOTO function score to no less than 57% as a proxy for functional improvement    Baseline 18% function    Time 12    Period Weeks    Status New    Target Date 05/09/21      PT LONG TERM GOAL #2   Title Pt will improve L elbow AROM to [facility] in order to improve comfort and functional ability    Baseline see flowsheet    Time 12    Period  Weeks    Status New    Target Date 05/09/21      PT LONG TERM GOAL #3   Title Pt will self report L UE pain 0/10 at worst for improved comfort and functional ability    Baseline 4/10 at worst    Time 12    Period Weeks    Status New    Target Date 05/09/21                   Plan - 03/30/21 0843     Clinical Impression Statement Pt was able to complete prescribed exercises with no adverse effect. Continues to have significant strength and ROM deficits in L elbow and proximal shoulder. Pt continues to benefit from skilled PT and will continue to be seen and progressed as tolerated.    PT Treatment/Interventions ADLs/Self Care Home Management;Electrical Stimulation;Cryotherapy;Therapeutic activities;Therapeutic exercise;Neuromuscular re-education;Patient/family education;Manual techniques;Passive range of motion;Taping;Vasopneumatic Device    PT Next Visit Plan assess response to HEP, L hand swelling, progress as able    PT Home Exercise Plan Access Code: [email or web address]             Patient will benefit from skilled therapeutic intervention in order to improve the following deficits and impairments:  Decreased  activity tolerance, Decreased endurance, Decreased mobility, Decreased range of motion, Decreased strength, Hypomobility, Impaired UE functional use, Pain  Visit Diagnosis: Pain in left elbow  Stiffness of left elbow, not elsewhere classified  Muscle weakness (generalized)     Problem List There are no problems to display for this patient.   Eloy End, PT, DPT 03/30/21 8:46 AM  [facility] [address] [place], [place], [address] Phone: [phone]   Fax:  [phone]  Name: Steven Aguirre MRN: [id number] Date of Birth: 1990-11-16

## 2021-04-05 ENCOUNTER — Other Ambulatory Visit: Payer: Self-pay

## 2021-04-05 ENCOUNTER — Encounter: Payer: Self-pay | Admitting: Physical Therapy

## 2021-04-05 ENCOUNTER — Ambulatory Visit: Payer: 59 | Attending: Physician Assistant | Admitting: Physical Therapy

## 2021-04-05 DIAGNOSIS — M25622 Stiffness of left elbow, not elsewhere classified: Secondary | ICD-10-CM | POA: Insufficient documentation

## 2021-04-05 DIAGNOSIS — M25522 Pain in left elbow: Secondary | ICD-10-CM | POA: Diagnosis not present

## 2021-04-05 DIAGNOSIS — M6281 Muscle weakness (generalized): Secondary | ICD-10-CM | POA: Diagnosis present

## 2021-04-05 NOTE — Therapy (Signed)
[facility] Outpatient Rehabilitation [facility] [address] [place], [place], [address] Phone: [phone]   Fax:  [phone]  Physical Therapy Treatment  Patient Details  Name: Steven Aguirre MRN: [id number] Date of Birth: 11/22/1990 Referring Provider (PT): Vernetta Honey, [place]   Encounter Date: 04/05/2021   PT End of Session - 04/05/21 1832     Visit Number 9    Number of Visits 21    Date for PT Re-Evaluation 05/09/21    Authorization Type UHC - FOTO 6th and 10th    PT Start Time 0630    PT Stop Time 0710    PT Time Calculation (min) 40 min    Activity Tolerance Patient tolerated treatment well;No increased pain    Behavior During Therapy [facility] for tasks assessed/performed             History reviewed. No pertinent past medical history.  Past Surgical History:  Procedure Laterality Date   KNEE SURGERY Right    ORIF ELBOW FRACTURE Left 01/25/2021   Procedure: OPEN REDUCTION INTERNAL FIXATION (ORIF) ELBOW/OLECRANON FRACTURE;  Surgeon: Bjorn Pippin, Henrry;  [facility];  Service: Orthopedics;  Laterality: Left;   ULNAR COLLATERAL LIGAMENT REPAIR Left 01/25/2021   Procedure: ULNAR COLLATERAL LIGAMENT REPAIR;  Surgeon: Bjorn Pippin, Korbin;  [facility];  Service: Orthopedics;  Laterality: Left;    There were no vitals filed for this visit.   Subjective Assessment - 04/05/21 1832     Subjective Pt presents to PT with no current pain. He has been compliant with HEP with no adverse effects. He reports that he is stretching at home frequently.    Pertinent History L ORIF of proximal ulna and L lateral ligament repair on 01/25/21; **updated precautions: ROM as tolerated, <5lb lifting restriction, no valgus stress across elbow, brace until 03/08/21 then discontinue, hand therapy for stiffness and swelling**             Therapeutic Exercise: - UBE - L1 - 4 min forward - Row - Black TB - 3x15 - Shoulder ext - YTB - 3x10 - ER isometric walkout  2x15 ea - YTB - IR isometric walkout 2x15 ea - RTB - eccentric lowering 5lb 3x10  - AROM flexion/ext of elbow - 2x10 (not today) - pronation/supination - 3# - 2x10 - wrist flexion/ext - 3# - 2x10 - farmer's carry 5lb L hand x 572ft     PT Short Term Goals - 02/14/21 1806       PT SHORT TERM GOAL #1   Title Pt will be compliant and knowledgeable with initial HEP for improved comfort and carryover    Baseline initial HEP given    Time 3    Period Weeks    Status New    Target Date 03/07/21               PT Long Term Goals - 02/14/21 1807       PT LONG TERM GOAL #1   Title Pt will improve FOTO function score to no less than 57% as a proxy for functional improvement    Baseline 18% function    Time 12    Period Weeks    Status New    Target Date 05/09/21      PT LONG TERM GOAL #2   Title Pt will improve L elbow AROM to [facility] in order to improve comfort and functional ability    Baseline see flowsheet    Time 12  Period Weeks    Status New    Target Date 05/09/21      PT LONG TERM GOAL #3   Title Pt will self report L UE pain 0/10 at worst for improved comfort and functional ability    Baseline 4/10 at worst    Time 12    Period Weeks    Status New    Target Date 05/09/21                   Plan - 04/06/21 0929     Clinical Impression Statement Pt is progressing well with therapy.  He tolerated session with no increase in pain.  His elbow flexion ROM is slowly improving.  He shows significant strength deficit in elbow flexion as well as ER strength with high levels of fatigue.  He must be cued frequently to maintain for with ER isometrics.  Continue per POC.    PT Treatment/Interventions ADLs/Self Care Home Management;Electrical Stimulation;Cryotherapy;Therapeutic activities;Therapeutic exercise;Neuromuscular re-education;Patient/family education;Manual techniques;Passive range of motion;Taping;Vasopneumatic Device    PT Next Visit Plan assess response  to HEP, L hand swelling, progress as able    PT Home Exercise Plan Access Code: [email or web address]             Patient will benefit from skilled therapeutic intervention in order to improve the following deficits and impairments:  Decreased activity tolerance, Decreased endurance, Decreased mobility, Decreased range of motion, Decreased strength, Hypomobility, Impaired UE functional use, Pain  Visit Diagnosis: Pain in left elbow  Stiffness of left elbow, not elsewhere classified  Muscle weakness (generalized)     Problem List There are no problems to display for this patient.   Alphonzo Severance PT, DPT 04/06/21 9:30 AM  [facility] [address] [place], [place], [address] Phone: [phone]   Fax:  [phone]  Name: Steven Aguirre MRN: [id number] Date of Birth: 1991/05/04

## 2021-04-12 ENCOUNTER — Ambulatory Visit: Payer: 59

## 2021-04-12 ENCOUNTER — Other Ambulatory Visit: Payer: Self-pay

## 2021-04-12 DIAGNOSIS — M6281 Muscle weakness (generalized): Secondary | ICD-10-CM

## 2021-04-12 DIAGNOSIS — M25622 Stiffness of left elbow, not elsewhere classified: Secondary | ICD-10-CM

## 2021-04-12 DIAGNOSIS — M25522 Pain in left elbow: Secondary | ICD-10-CM

## 2021-04-12 NOTE — Therapy (Signed)
[facility] Outpatient Rehabilitation [facility] [address] [place], [place], [address] Phone: [phone]   Fax:  [phone]  Physical Therapy Treatment  Patient Details  Name: Steven Aguirre MRN: [id number] Date of Birth: 14-Apr-1991 Referring Provider (PT): Vernetta Honey, [place]   Encounter Date: 04/12/2021   PT End of Session - 04/12/21 1828     Visit Number 10    Number of Visits 21    Date for PT Re-Evaluation 05/09/21    Authorization Type UHC - FOTO 6th and 10th    PT Start Time 1830    PT Stop Time 1911    PT Time Calculation (min) 41 min    Activity Tolerance Patient tolerated treatment well;No increased pain    Behavior During Therapy [facility] for tasks assessed/performed             No past medical history on file.  Past Surgical History:  Procedure Laterality Date   KNEE SURGERY Right    ORIF ELBOW FRACTURE Left 01/25/2021   Procedure: OPEN REDUCTION INTERNAL FIXATION (ORIF) ELBOW/OLECRANON FRACTURE;  Surgeon: Bjorn Pippin, Vega;  [facility]: [facility];  Service: Orthopedics;  Laterality: Left;   ULNAR COLLATERAL LIGAMENT REPAIR Left 01/25/2021   Procedure: ULNAR COLLATERAL LIGAMENT REPAIR;  Surgeon: Bjorn Pippin, Kendric;  [facility];  Service: Orthopedics;  Laterality: Left;    There were no vitals filed for this visit.   Subjective Assessment - 04/12/21 1828     Subjective Pt presents to PT with no current pain in L UE. He has been compliant with HEP with no adverse effects noted. Continues to stretch at home frequently throughout the day. PT is ready to begin PT at this time.    Currently in Pain? No/denies    Pain Score 0-No pain           [facility] Adult PT Treatment/Exercise:   Therapeutic Exercise:  UBE - L 2.0 - 4 min (67fwd/2bwd) while taking subjective Row - 10lbs - 3x10 L Shoulder ext - RTB - 2x10 - difficult ER isometric walkout 2x10 - RTB IR isometric walkout x 10 - 3 lbs Eccentric lowering 5lb 3x10  Farmer's carry 5lb L  hand x 536ft L elbow ext stretch 2x30 sec standing Wall slide into L elbow flex x 10  Interventions Not Performed Today: AROM flexion/ext of elbow - 2x10 (not today) pronation/supination - 3# - 2x10 wrist flexion/ext - 3# - 2x10                               PT Short Term Goals - 02/14/21 1806       PT SHORT TERM GOAL #1   Title Pt will be compliant and knowledgeable with initial HEP for improved comfort and carryover    Baseline initial HEP given    Time 3    Period Weeks    Status New    Target Date 03/07/21               PT Long Term Goals - 02/14/21 1807       PT LONG TERM GOAL #1   Title Pt will improve FOTO function score to no less than 57% as a proxy for functional improvement    Baseline 18% function    Time 12    Period Weeks    Status New    Target Date 05/09/21      PT LONG TERM  GOAL #2   Title Pt will improve L elbow AROM to [facility] in order to improve comfort and functional ability    Baseline see flowsheet    Time 12    Period Weeks    Status New    Target Date 05/09/21      PT LONG TERM GOAL #3   Title Pt will self report L UE pain 0/10 at worst for improved comfort and functional ability    Baseline 4/10 at worst    Time 12    Period Weeks    Status New    Target Date 05/09/21                   Plan - 04/12/21 1832     Clinical Impression Statement Pt again able to complete prescribed exercises with no adverse effect. Continues to demo decreased L elbow ROM and L UE weakness. Today we focused on continuing to improve strength and range per protocol as toelrated. Pt should continue to be seen and progressed per POC.    PT Treatment/Interventions ADLs/Self Care Home Management;Electrical Stimulation;Cryotherapy;Therapeutic activities;Therapeutic exercise;Neuromuscular re-education;Patient/family education;Manual techniques;Passive range of motion;Taping;Vasopneumatic Device    PT Next Visit Plan assess  response to HEP, L hand swelling, progress as able    PT Home Exercise Plan Access Code: [email or web address]             Patient will benefit from skilled therapeutic intervention in order to improve the following deficits and impairments:  Decreased activity tolerance, Decreased endurance, Decreased mobility, Decreased range of motion, Decreased strength, Hypomobility, Impaired UE functional use, Pain  Visit Diagnosis: Pain in left elbow  Stiffness of left elbow, not elsewhere classified  Muscle weakness (generalized)     Problem List There are no problems to display for this patient.   Eloy End, PT 04/13/2021, 7:51 AM  [facility] [address] [place], [place], [address] Phone: [phone]   Fax:  [phone]  Name: Steven Aguirre MRN: [id number] Date of Birth: January 21, 1991

## 2021-04-26 ENCOUNTER — Ambulatory Visit: Payer: 59 | Admitting: Physical Therapy

## 2021-04-26 ENCOUNTER — Encounter: Payer: Self-pay | Admitting: Physical Therapy

## 2021-04-26 ENCOUNTER — Other Ambulatory Visit: Payer: Self-pay

## 2021-04-26 DIAGNOSIS — M6281 Muscle weakness (generalized): Secondary | ICD-10-CM

## 2021-04-26 DIAGNOSIS — M25622 Stiffness of left elbow, not elsewhere classified: Secondary | ICD-10-CM

## 2021-04-26 DIAGNOSIS — M25522 Pain in left elbow: Secondary | ICD-10-CM | POA: Diagnosis not present

## 2021-04-26 NOTE — Therapy (Signed)
[facility] Outpatient Rehabilitation [facility] [address] [place], [place], [address] Phone: [phone]   Fax:  [phone]  Physical Therapy Treatment  Patient Details  Name: Steven Aguirre MRN: [id number] Date of Birth: [birth year] Referring Provider (PT): Vernetta Honey, [place]   Encounter Date: 04/26/2021   PT End of Session - 04/26/21 1831     Visit Number 11    Number of Visits 21    Date for PT Re-Evaluation 05/09/21    Authorization Type UHC - FOTO 6th and 10th    PT Start Time 1830    PT Stop Time 1910    PT Time Calculation (min) 40 min    Activity Tolerance Patient tolerated treatment well;No increased pain    Behavior During Therapy [facility] for tasks assessed/performed             History reviewed. No pertinent past medical history.  Past Surgical History:  Procedure Laterality Date   KNEE SURGERY Right    ORIF ELBOW FRACTURE Left 01/25/2021   Procedure: OPEN REDUCTION INTERNAL FIXATION (ORIF) ELBOW/OLECRANON FRACTURE;  Surgeon: Bjorn Pippin, Dimetrius;  [facility];  Service: Orthopedics;  Laterality: Left;   ULNAR COLLATERAL LIGAMENT REPAIR Left 01/25/2021   Procedure: ULNAR COLLATERAL LIGAMENT REPAIR;  Surgeon: Bjorn Pippin, Hattie;  [facility];  Service: Orthopedics;  Laterality: Left;    There were no vitals filed for this visit.   Subjective Assessment - 04/26/21 1845     Subjective Pt presents to PT with no current pain in L UE. He went to the [place] today and was cleared for no weight restrictions.             [facility] Adult PT Treatment/Exercise:   Therapeutic Exercise:  UBE - L 2.0 - 5 min (66fwd/2bwd) while taking subjective Row - 10lbs - 3x10 L shoulder ranger for elbow ext and shoulder flexion - 3x10 - 30'' L Shoulder ext - RTB - unilateral - 2x10 - difficult ER RTB - 10x Eccentric lowering 6lb 3x10  Farmer's carry 10lb L hand x 56ft   Interventions Not Performed Today: AROM flexion/ext of elbow - 2x10 (not  today) pronation/supination - 3# - 2x10 wrist flexion/ext - 3# - 2x10  [place]/[facility] - Instructing patient on LLLD stretching with YTB loop for elbow flexion.  Pt instructed to avoid all but mild pain and create gentle stretch for elbow flexion up to 20 min at a time.  Pt confirms understanding    PT Short Term Goals - 02/14/21 1806       PT SHORT TERM GOAL #1   Title Pt will be compliant and knowledgeable with initial HEP for improved comfort and carryover    Baseline initial HEP given    Time 3    Period Weeks    Status New    Target Date 03/07/21               PT Long Term Goals - 02/14/21 1807       PT LONG TERM GOAL #1   Title Pt will improve FOTO function score to no less than 57% as a proxy for functional improvement    Baseline 18% function    Time 12    Period Weeks    Status New    Target Date 05/09/21      PT LONG TERM GOAL #2   Title Pt will improve L elbow AROM to [facility] in order to improve comfort and functional ability  Baseline see flowsheet    Time 12    Period Weeks    Status New    Target Date 05/09/21      PT LONG TERM GOAL #3   Title Pt will self report L UE pain 0/10 at worst for improved comfort and functional ability    Baseline 4/10 at worst    Time 12    Period Weeks    Status New    Target Date 05/09/21                   Plan - 04/27/21 0914     Clinical Impression Statement Pt reports no increase in baseline pain following therapy  HEP was reviewed, but left unchanged    Overall, Steven Aguirre is progressing fair with therapy.  Today we concentrated on rotator cuff strengthening, periscapular strengthening, and elbow ROM .  Pt no longer has weight restrictions for elbow (per pt).  We were able to progress intensity of several exercises.  Elbow ext is coming along nicely, while elbow flexion remains limited.  Pt instructed in LLLD for elbow flexion.  Pt will continue to benefit from skilled physical therapy to address  remaining deficits and achieve listed goals.  Continue per POC.    PT Treatment/Interventions ADLs/Self Care Home Management;Electrical Stimulation;Cryotherapy;Therapeutic activities;Therapeutic exercise;Neuromuscular re-education;Patient/family education;Manual techniques;Passive range of motion;Taping;Vasopneumatic Device    PT Next Visit Plan assess response to HEP, L hand swelling, progress as able    PT Home Exercise Plan Access Code: [email or web address]             Patient will benefit from skilled therapeutic intervention in order to improve the following deficits and impairments:  Decreased activity tolerance, Decreased endurance, Decreased mobility, Decreased range of motion, Decreased strength, Hypomobility, Impaired UE functional use, Pain  Visit Diagnosis: Pain in left elbow  Stiffness of left elbow, not elsewhere classified  Muscle weakness (generalized)     Problem List There are no problems to display for this patient.   Fredderick Phenix, PT 04/27/2021, 9:15 AM  [facility] [address] [place], [place], [address] Phone: [phone]   Fax:  [phone]  Name: Steven Aguirre MRN: [id number] Date of Birth: [birth year]

## 2021-05-03 ENCOUNTER — Other Ambulatory Visit: Payer: Self-pay

## 2021-05-03 ENCOUNTER — Ambulatory Visit: Payer: 59

## 2021-05-03 DIAGNOSIS — M6281 Muscle weakness (generalized): Secondary | ICD-10-CM

## 2021-05-03 DIAGNOSIS — M25522 Pain in left elbow: Secondary | ICD-10-CM | POA: Diagnosis not present

## 2021-05-03 DIAGNOSIS — M25622 Stiffness of left elbow, not elsewhere classified: Secondary | ICD-10-CM

## 2021-05-03 NOTE — Therapy (Addendum)
[facility] [address] [place], [place], [address] Phone: [phone]   Fax:  [phone]  PHYSICAL THERAPY UNPLANNED DISCHARGE SUMMARY   Visits from Start of Care: 12  Current functional level related to goals / functional outcomes: Current status unknown   Remaining deficits: Current status unknown   Education / Equipment: Pt has not returned since visit listed below  Patient goals were not assessed. Patient is being discharged due to not returning since the last visit.  (the below note was addended to include the above D/C summary on 05/17/21)   Physical Therapy Treatment  Patient Details  Name: Steven Aguirre MRN: [id number] Date of Birth: Feb 14, 1991 Referring Provider (PT): Vernetta Honey, [place]   Encounter Date: 05/03/2021   PT End of Session - 05/03/21 1826     Visit Number 12    Number of Visits 21    Date for PT Re-Evaluation 05/09/21    Authorization Type [employer] - FOTO 6th and 10th    PT Start Time 1830    PT Stop Time 1908    PT Time Calculation (min) 38 min    Activity Tolerance Patient tolerated treatment well;No increased pain    Behavior During Therapy [facility] for tasks assessed/performed             No past medical history on file.  Past Surgical History:  Procedure Laterality Date   KNEE SURGERY Right    ORIF ELBOW FRACTURE Left 01/25/2021   Procedure: OPEN REDUCTION INTERNAL FIXATION (ORIF) ELBOW/OLECRANON FRACTURE;  Surgeon: Bjorn Pippin, Kate;  Location: [facility];  Service: Orthopedics;  Laterality: Left;   ULNAR COLLATERAL LIGAMENT REPAIR Left 01/25/2021   Procedure: ULNAR COLLATERAL LIGAMENT REPAIR;  Surgeon: Bjorn Pippin, [place];  Location: [facility];  Service: Orthopedics;  Laterality: Left;    There were no vitals filed for this visit.   Subjective Assessment - 05/03/21 1827     Subjective Pt presents to PT with no current pain or discomfort. Continues to be compliant with HEP with no  adverse effect. Ready to begin PT at this time.    Currently in Pain? No/denies    Pain Score 0-No pain           [facility] Adult PT Treatment/Exercise:   Therapeutic Exercise:  UBE - L 2.0 - 5 min (69fwd/2bwd) while taking subjective Row - 10lbs - 3x10 - underhand grip L shoulder ranger for elbow ext and shoulder flexion - 3x10 - 30'' L Shoulder ext - RTB - unilateral - 2x10 - difficult ER RTB - 2x10 L Eccentric lowering 6lb 3x10  Farmer's carry 10lb L hand x 555ft L UE wall slide into L elbow flex x 10 Pulley's into L shouler flex/IR x 10 ea   Interventions Not Performed Today: AROM flexion/ext of elbow - 2x10 (not today) pronation/supination - 3# - 2x10 wrist flexion/ext - 3# - 2x10   [place]/[facility] - Instructing patient on LLLD stretching with YTB loop for elbow flexion.  Pt instructed to avoid all but mild pain and create gentle stretch for elbow flexion up to 20 min at a time.  Pt confirms understanding                               PT Short Term Goals - 02/14/21 1806       PT SHORT TERM GOAL #1   Title Pt will be compliant and knowledgeable with  initial HEP for improved comfort and carryover    Baseline initial HEP given    Time 3    Period Weeks    Status New    Target Date 03/07/21               PT Long Term Goals - 02/14/21 1807       PT LONG TERM GOAL #1   Title Pt will improve FOTO function score to no less than 57% as a proxy for functional improvement    Baseline 18% function    Time 12    Period Weeks    Status New    Target Date 05/09/21      PT LONG TERM GOAL #2   Title Pt will improve L elbow AROM to [facility] in order to improve comfort and functional ability    Baseline see flowsheet    Time 12    Period Weeks    Status New    Target Date 05/09/21      PT LONG TERM GOAL #3   Title Pt will self report L UE pain 0/10 at worst for improved comfort and functional ability    Baseline 4/10 at worst    Time 12    Period Weeks     Status New    Target Date 05/09/21                   Plan - 05/03/21 1836     Clinical Impression Statement Pt was able to complete all prescribed exercises with no adverse effect. Shows continued decrease in L elbow ROM as well as L shoulder mobility. Will have injection next week to see if this improves mobility of L shoulder and comfort. PT will continue to progress exercises as tolerated for improving strength and ROM of L UE.    PT Treatment/Interventions ADLs/Self Care Home Management;Electrical Stimulation;Cryotherapy;Therapeutic activities;Therapeutic exercise;Neuromuscular re-education;Patient/family education;Manual techniques;Passive range of motion;Taping;Vasopneumatic Device    PT Next Visit Plan assess response to HEP, L hand swelling, progress as able    PT Home Exercise Plan Access Code: [email or web address]             Patient will benefit from skilled therapeutic intervention in order to improve the following deficits and impairments:  Decreased activity tolerance, Decreased endurance, Decreased mobility, Decreased range of motion, Decreased strength, Hypomobility, Impaired UE functional use, Pain  Visit Diagnosis: Pain in left elbow  Stiffness of left elbow, not elsewhere classified  Muscle weakness (generalized)     Problem List There are no problems to display for this patient.   Eloy End, PT 05/04/2021, 2:56 PM  [facility] [address] [place], [place], [address] Phone: [phone]   Fax:  [phone]  Name: Steven Aguirre MRN: [id number] Date of Birth: March 17, 1991

## 2021-05-10 ENCOUNTER — Encounter: Payer: 59 | Admitting: Physical Therapy

## 2021-11-30 IMAGING — RF DG ELBOW 2V*L*
1 series · 2 of 2 positions shown · non-contrast
Comparison: 01/19/2021

CLINICAL DATA: 29-year-old male. LEFT elbow surgery. 52 seconds of
fluoroscopy.

EXAM:
LEFT ELBOW - 2 VIEW; DG C-ARM 1-60 MIN-NO REPORT

[Series 1: unknown protocol · 0.14mm/px · 2 of 2 slices shown]
[im 1/2]
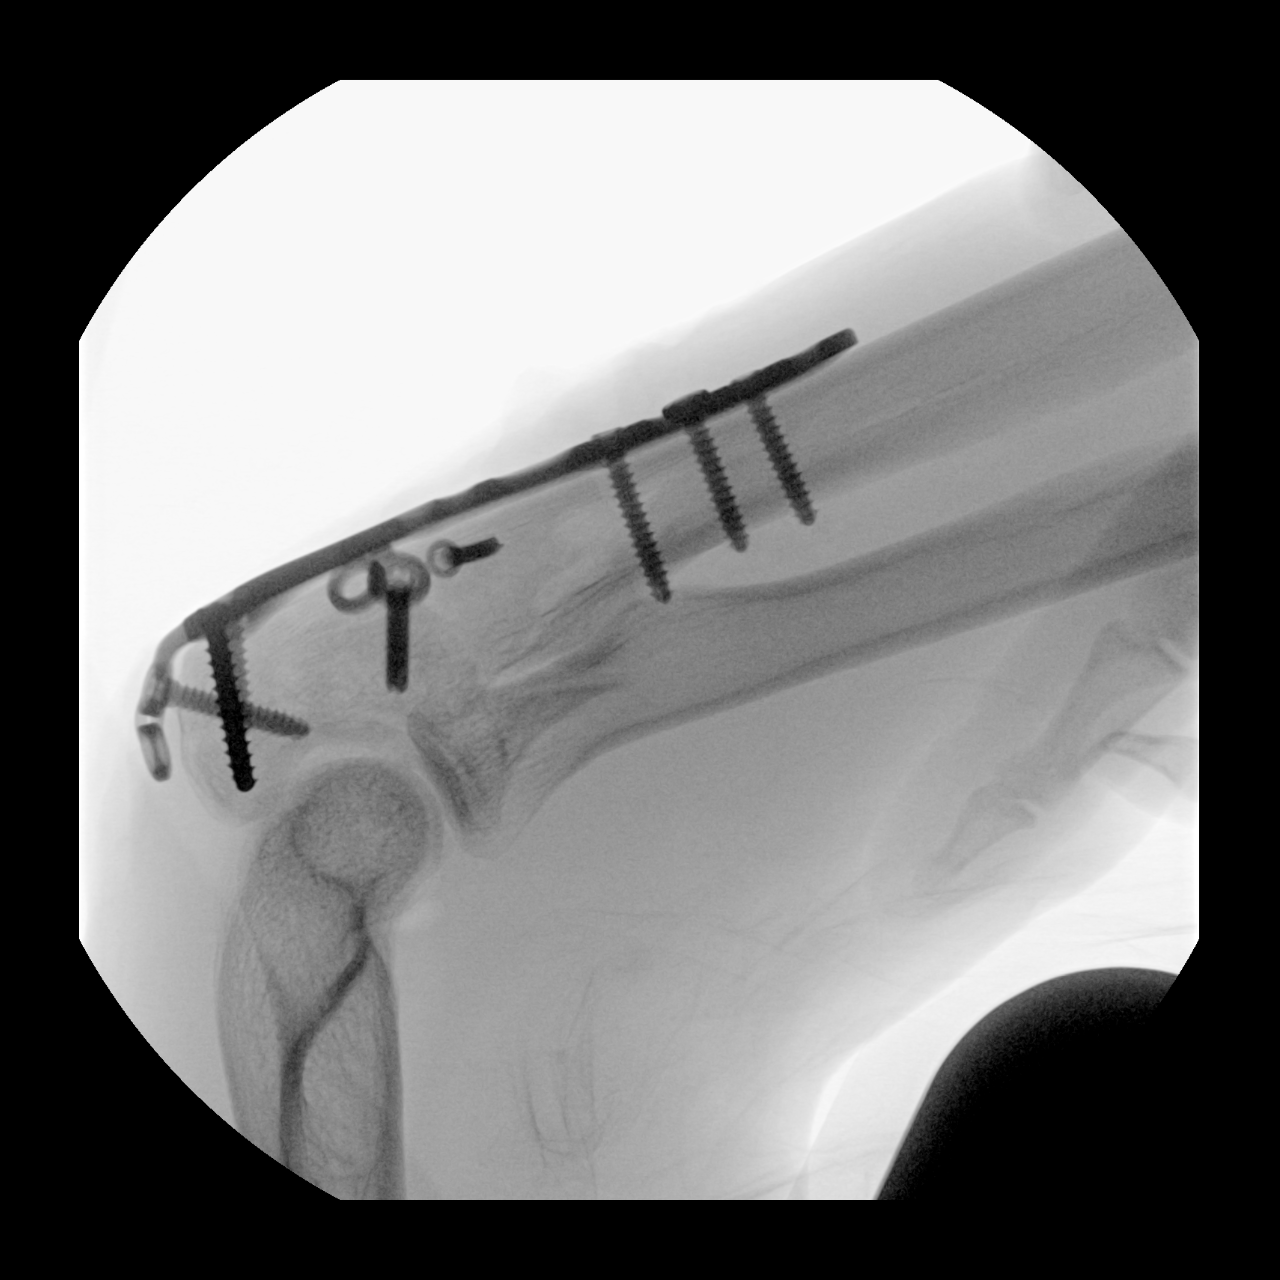
[im 2/2]
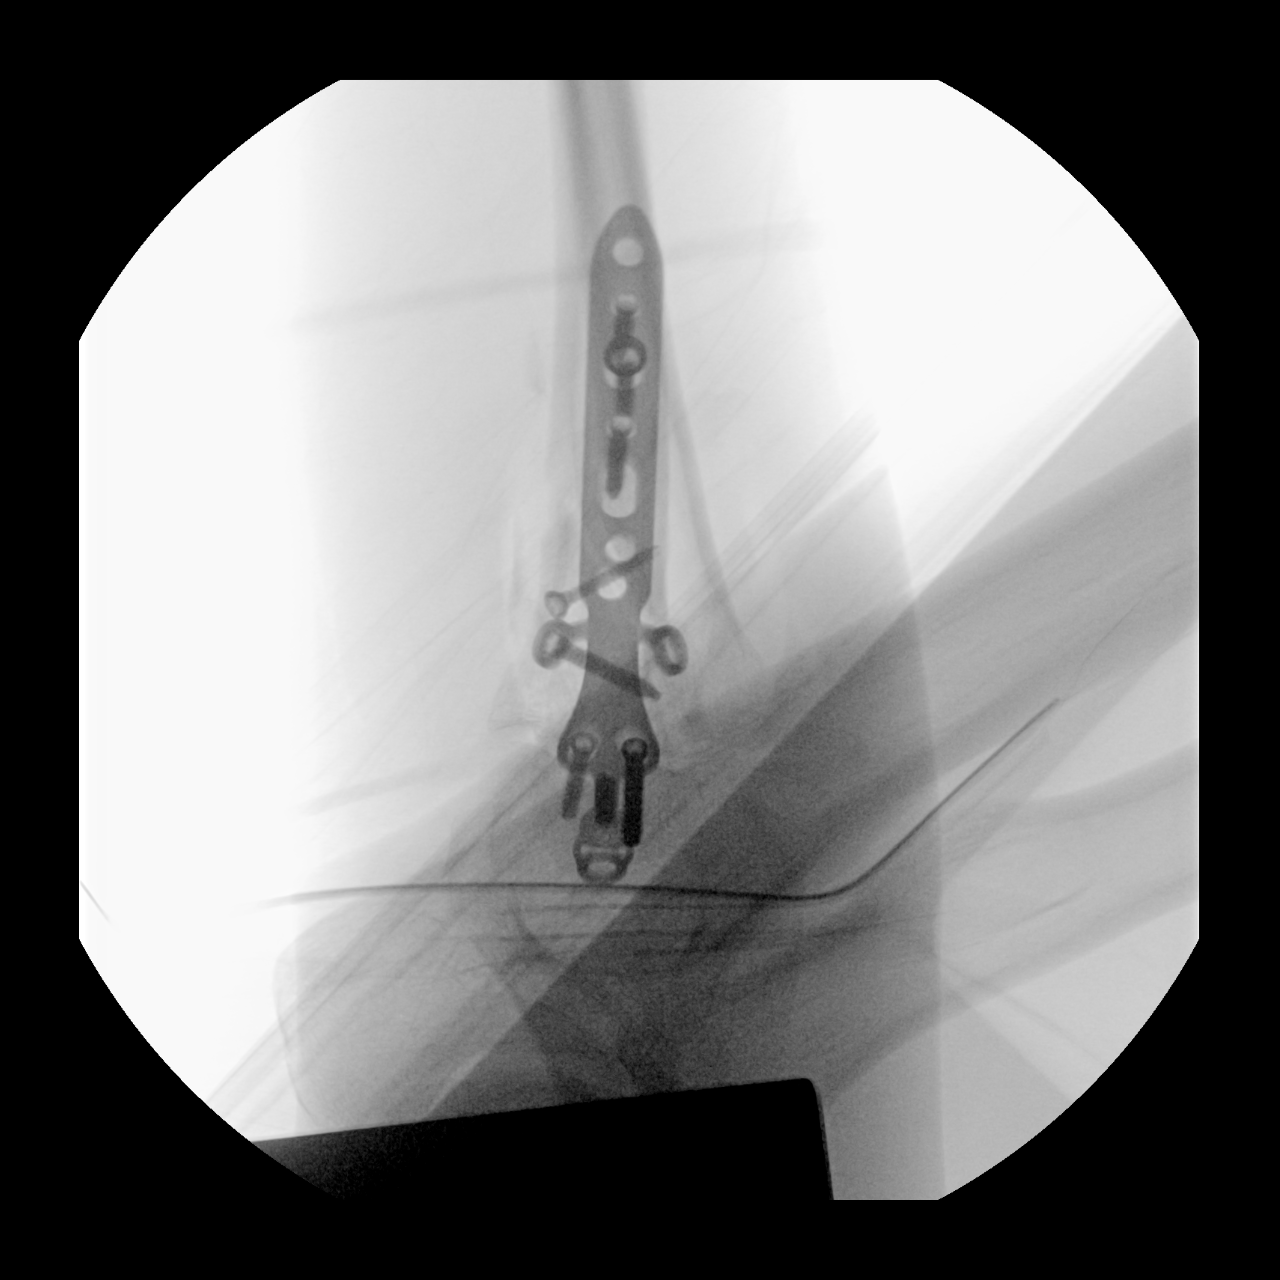

[2 of 2 positions shown; findings below may reference images not displayed]

FINDINGS: Patient has undergone ORIF of the proximal olecranon with screw
plate. No dislocation or interval fracture.
IMPRESSION: Interval ORIF of the LEFT olecranon.
